# Patient Record
Sex: Male | Born: 1975 | Race: White | Hispanic: No | Marital: Married | Smoking: Never smoker
Health system: Southern US, Community
[De-identification: ages and names within clinical notes are randomized; demographics above are authoritative.]

## PROBLEM LIST (undated history)

## (undated) DIAGNOSIS — R569 Unspecified convulsions: Secondary | ICD-10-CM

## (undated) DIAGNOSIS — E119 Type 2 diabetes mellitus without complications: Secondary | ICD-10-CM

## (undated) DIAGNOSIS — G4730 Sleep apnea, unspecified: Secondary | ICD-10-CM

## (undated) HISTORY — PX: TONSILLECTOMY: SUR1361

## (undated) HISTORY — PX: APPENDECTOMY: SHX54

## (undated) HISTORY — DX: Unspecified convulsions: R56.9

---

## 2008-08-05 ENCOUNTER — Encounter (INDEPENDENT_AMBULATORY_CARE_PROVIDER_SITE_OTHER): Payer: Self-pay | Admitting: Interventional Radiology

## 2008-08-05 ENCOUNTER — Ambulatory Visit (HOSPITAL_COMMUNITY): Admission: RE | Admit: 2008-08-05 | Discharge: 2008-08-05 | Payer: Self-pay | Admitting: Internal Medicine

## 2010-01-19 IMAGING — US US BIOPSY
1 series · 7 of 7 positions shown · non-contrast
Comparison: none

Clinical: Elevated liver function tests, jaundice; request is made
for random core liver biopsy.

ULTRASOUND-GUIDED RANDOM CORE LIVER BIOPSY:
An ultrasound guided liver biopsy was thoroughly discussed with the
patient and questions were answered. The benefits, risks,
alternatives, and complications were also discussed. The patient
understands and wishes to proceed with the procedure. A verbal as
well as written consent was obtained.
Survey ultrasound of the liver was performed and an appropriate
skin entry site was determined. There was no evidence of
intrahepatic ductal dilatation and the common bile duct diameter
was within normal limits at 3 mm.  Incidental note is made of
gallstones and associated sludge.  Skin site was marked, prepped
with Betadine, and draped in usual sterile fashion, and infiltrated
locally with 1% Lidocaine. 100 mcg Intravenous Fentanyl and  2 mg
Intravenous Versed were administered as conscious sedation for 15
minutes during continuous cardiorespiratory monitoring by the
radiology RN. A 17 gauge Trocar needle was advanced under
ultrasound guidance into the liver (right hepatic lobe).  3 coaxial
18 gauge core samples were then obtained through the guide needle.
The guide needle was removed. Post procedure scans demonstrate no
apparent complication.

[Series 1: us biopsy · 0.28mm/px · 7 of 7 slices shown]
[im 1/7]
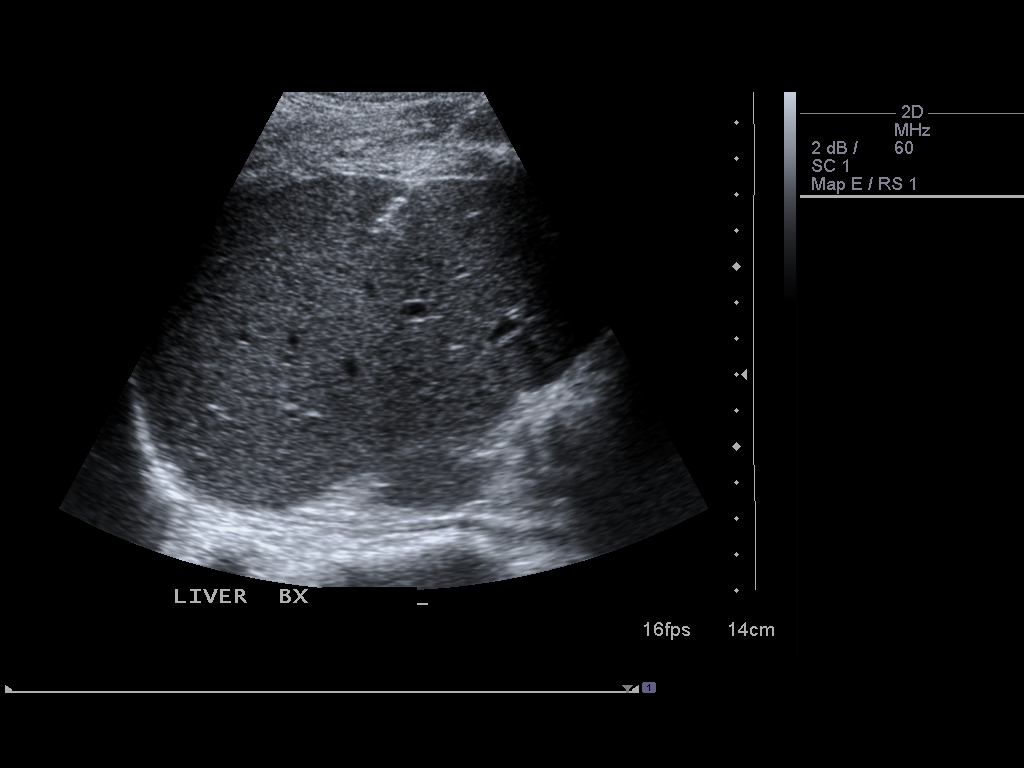
[im 2/7]
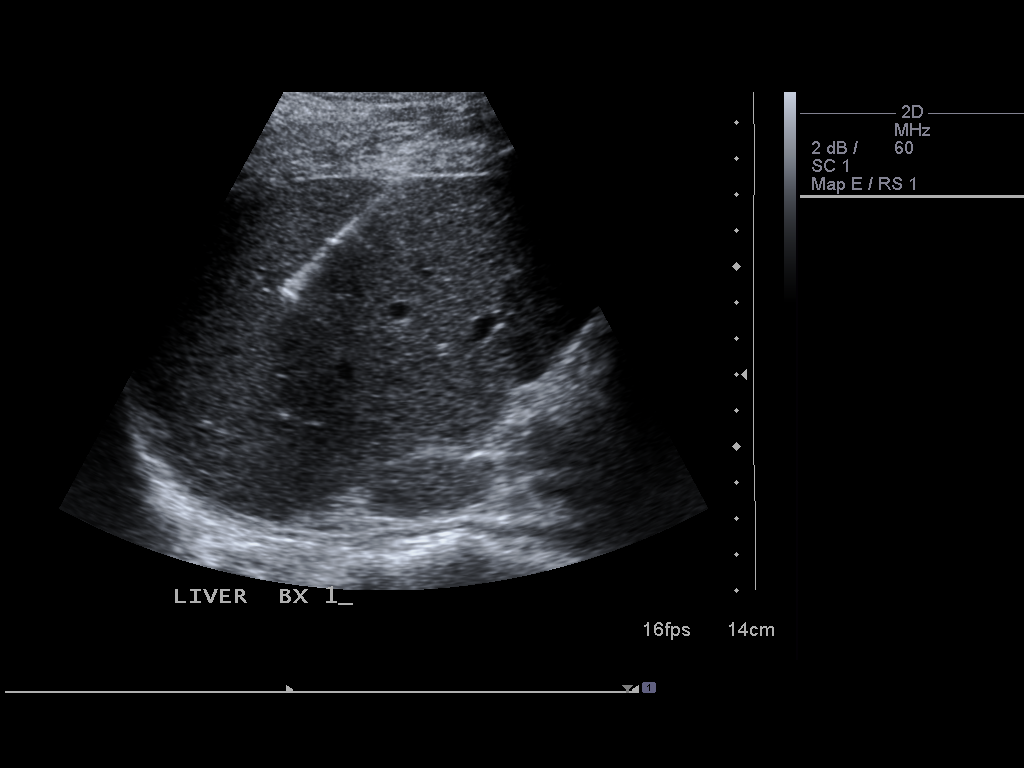
[im 3/7]
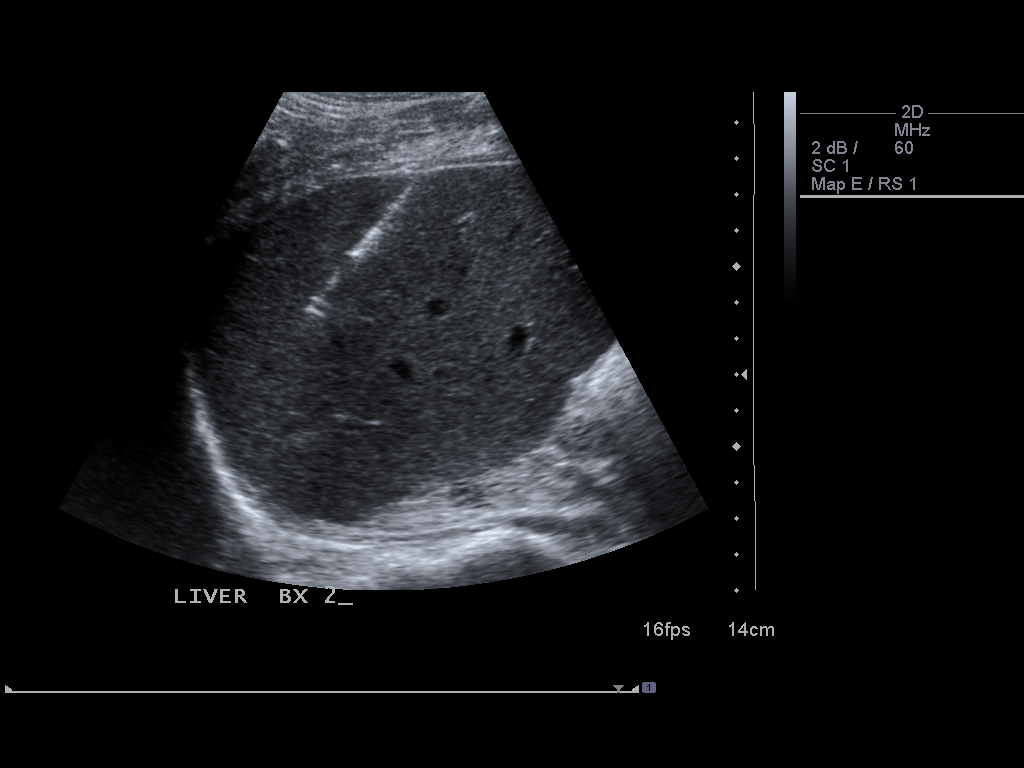
[im 4/7]
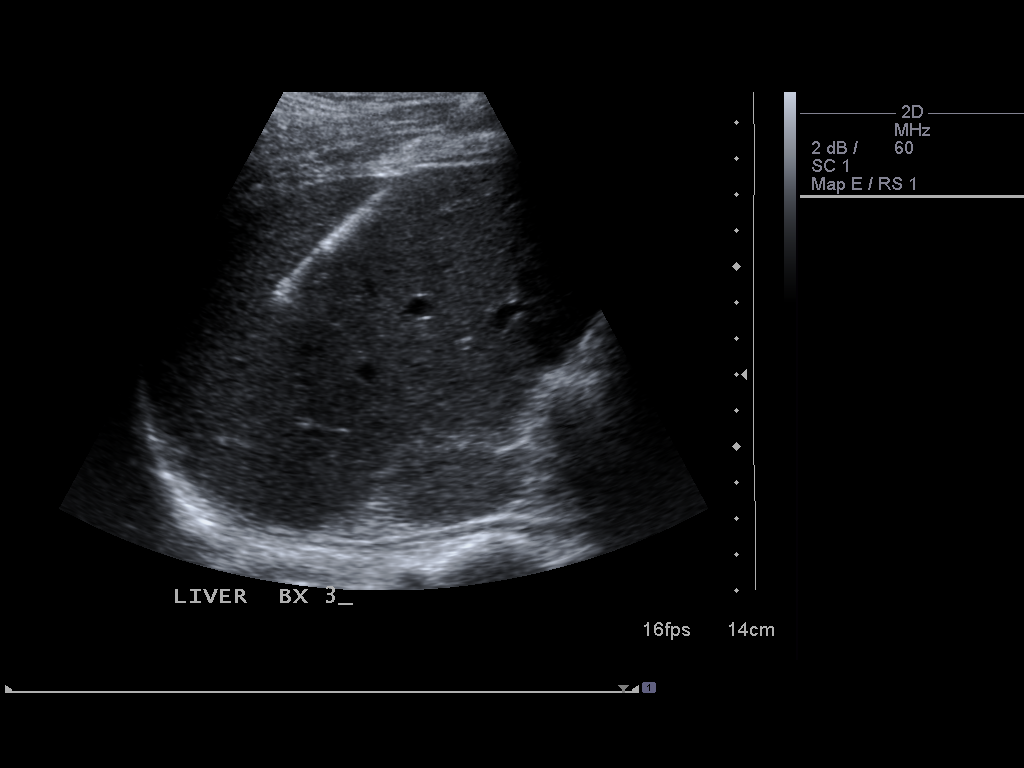
[im 5/7]
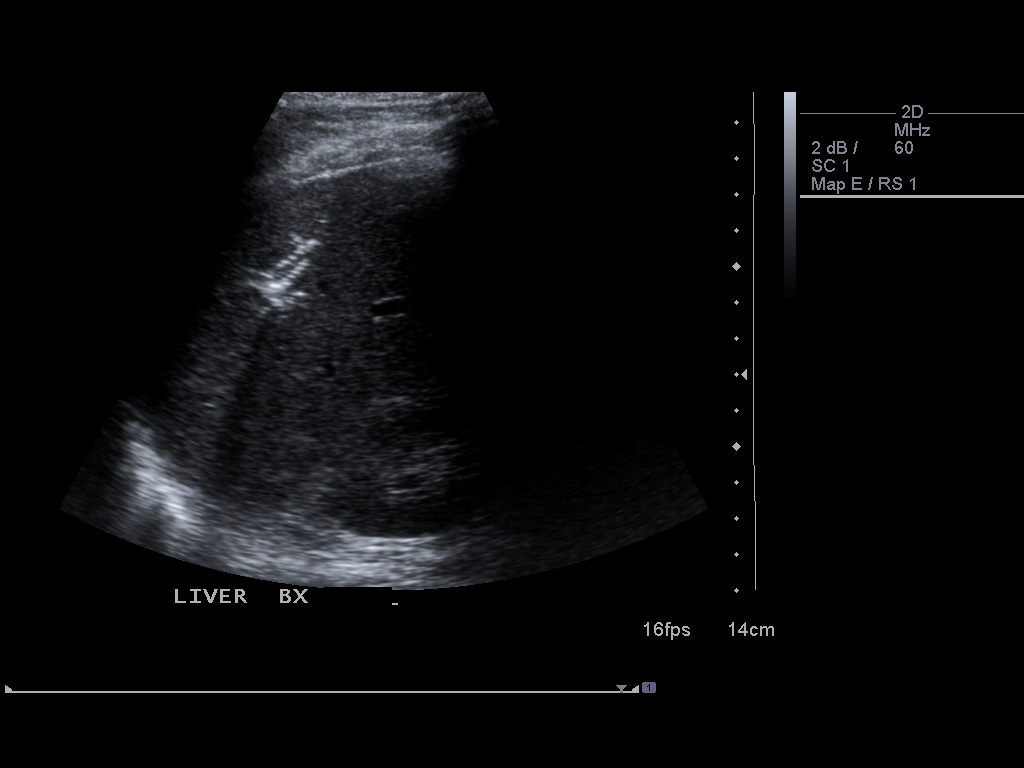
[im 6/7]
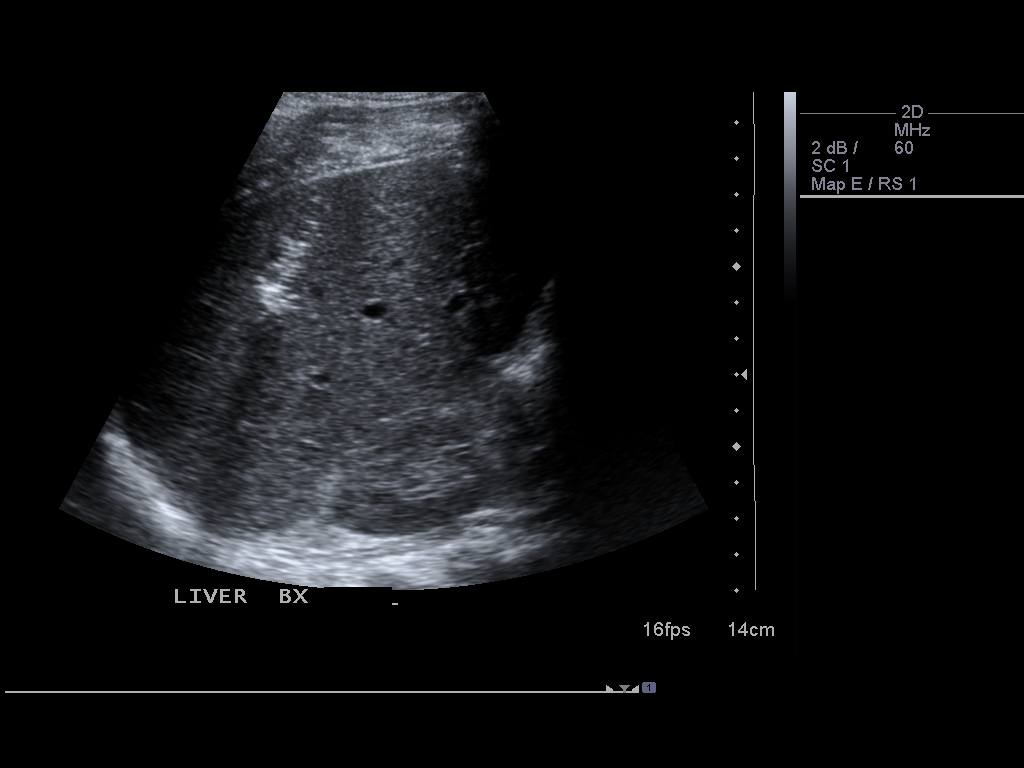
[im 7/7]
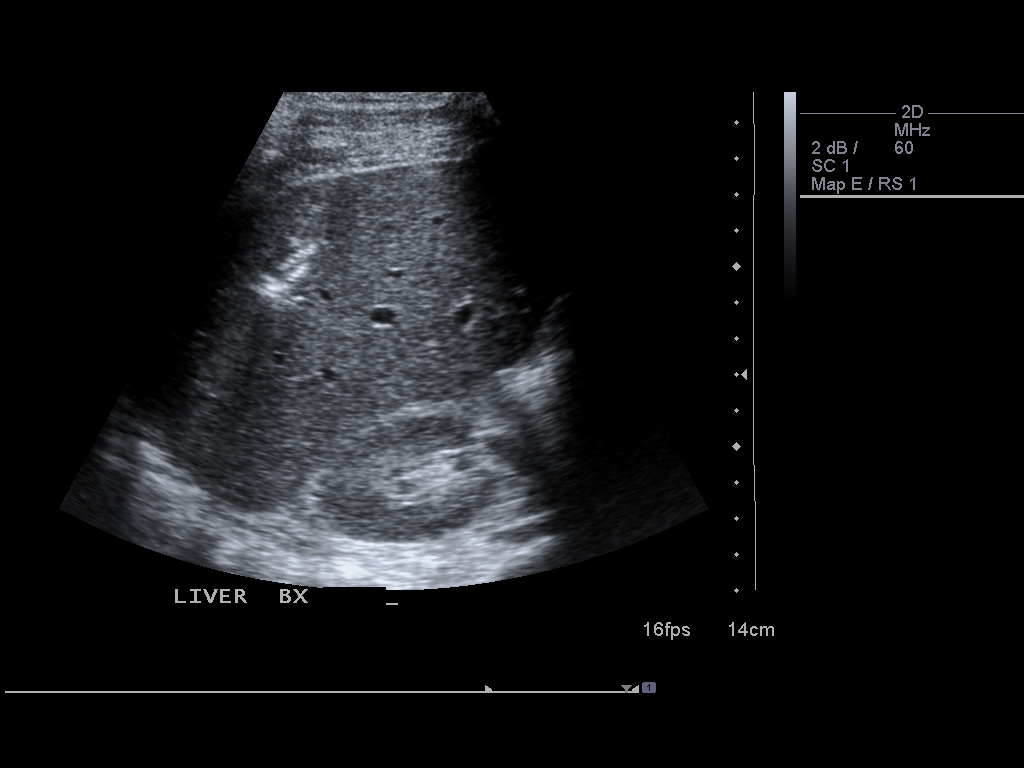

[7 of 7 positions shown; findings below may reference images not displayed]

IMPRESSION: 1. Technically successful ultrasound guided random core liver
biopsy. No evidence of intrahepatic ductal dilatation with normal
common bile duct diameter at 3 mm. Incidental note is made of
gallstones with associated sludge. The above images were reviewed
by Dr Romyka Miraka.

Read by: Loreto, Sherelle.-BULEUS

## 2010-04-09 LAB — PROTIME-INR
INR: 0.9 (ref 0.00–1.49)
Prothrombin Time: 12.1 seconds (ref 11.6–15.2)

## 2010-04-09 LAB — CBC
HCT: 41.2 % (ref 39.0–52.0)
Hemoglobin: 14.5 g/dL (ref 13.0–17.0)
MCHC: 35.3 g/dL (ref 30.0–36.0)
MCV: 85.3 fL (ref 78.0–100.0)
Platelets: 251 10*3/uL (ref 150–400)
RBC: 4.83 MIL/uL (ref 4.22–5.81)
RDW: 17 % — ABNORMAL HIGH (ref 11.5–15.5)
WBC: 8 10*3/uL (ref 4.0–10.5)

## 2010-04-09 LAB — GLUCOSE, CAPILLARY
Glucose-Capillary: 141 mg/dL — ABNORMAL HIGH (ref 70–99)
Glucose-Capillary: 194 mg/dL — ABNORMAL HIGH (ref 70–99)

## 2010-04-09 LAB — APTT: aPTT: 23 seconds — ABNORMAL LOW (ref 24–37)

## 2011-07-13 DIAGNOSIS — G40909 Epilepsy, unspecified, not intractable, without status epilepticus: Secondary | ICD-10-CM | POA: Insufficient documentation

## 2012-10-09 ENCOUNTER — Telehealth: Payer: Self-pay | Admitting: Endocrinology

## 2012-10-09 MED ORDER — INSULIN LISPRO 100 UNIT/ML CARTRIDGE: SUBCUTANEOUS | Status: DC

## 2012-10-09 NOTE — Telephone Encounter (Signed)
rx sent

## 2012-10-25 ENCOUNTER — Other Ambulatory Visit (INDEPENDENT_AMBULATORY_CARE_PROVIDER_SITE_OTHER): Payer: PRIVATE HEALTH INSURANCE

## 2012-10-25 ENCOUNTER — Other Ambulatory Visit: Payer: Self-pay | Admitting: *Deleted

## 2012-10-25 DIAGNOSIS — E119 Type 2 diabetes mellitus without complications: Secondary | ICD-10-CM

## 2012-10-25 LAB — COMPLETE METABOLIC PANEL WITH GFR
ALT: 22 U/L (ref 0–53)
AST: 14 U/L (ref 0–37)
Albumin: 4.1 g/dL (ref 3.5–5.2)
Alkaline Phosphatase: 52 U/L (ref 39–117)
BUN: 15 mg/dL (ref 6–23)
CO2: 27 mEq/L (ref 19–32)
Calcium: 9.5 mg/dL (ref 8.4–10.5)
Chloride: 101 mEq/L (ref 96–112)
Creat: 1.15 mg/dL (ref 0.50–1.35)
GFR, Est African American: >89 mL/min
GFR, Est Non African American: 82 mL/min
Glucose, Bld: 216 mg/dL — ABNORMAL HIGH (ref 70–99)
Potassium: 4.5 mEq/L (ref 3.5–5.3)
Sodium: 136 mEq/L (ref 135–145)
Total Bilirubin: 1.6 mg/dL — ABNORMAL HIGH (ref 0.3–1.2)
Total Protein: 6.3 g/dL (ref 6.0–8.3)

## 2012-10-25 LAB — MICROALBUMIN / CREATININE URINE RATIO
Creatinine,U: 75.6 mg/dL
Microalb Creat Ratio: 0.3 mg/g (ref 0.0–30.0)
Microalb, Ur: 0.2 mg/dL (ref 0.0–1.9)

## 2012-10-25 LAB — HEMOGLOBIN A1C: Hgb A1c MFr Bld: 7.5 % — ABNORMAL HIGH (ref 4.6–6.5)

## 2012-10-29 ENCOUNTER — Ambulatory Visit (INDEPENDENT_AMBULATORY_CARE_PROVIDER_SITE_OTHER): Payer: PRIVATE HEALTH INSURANCE | Admitting: Endocrinology

## 2012-10-29 ENCOUNTER — Encounter: Payer: Self-pay | Admitting: Endocrinology

## 2012-10-29 VITALS — BP 122/82 | HR 68 | Temp 98.2°F | Resp 12 | Ht 68.0 in | Wt 186.3 lb

## 2012-10-29 DIAGNOSIS — E109 Type 1 diabetes mellitus without complications: Secondary | ICD-10-CM | POA: Insufficient documentation

## 2012-10-29 DIAGNOSIS — Z23 Encounter for immunization: Secondary | ICD-10-CM

## 2012-10-29 NOTE — Progress Notes (Addendum)
Tommy Newman is an 37 y.o. male.   Reason for Appointment: Insulin Pump followup:   History of Present Illness   Diagnosis: Type 1 DIABETES MELITUS, date of diagnosis: 1991      DIABETES history: He has been on an insulin pump since 1997 His A1c in the last year has ranged between 6.3-7%, was 6.7% in 5/14 He is periodically able to make adjustments on his pump when he has tendency to high or low readings However although he knows how to carbohydrate fairly well he does not enter the carbohydrate in his pump at mealtime Generally checking his blood sugar frequently throughout the day  CURRENT insulin pump:  Medtronic 522  The pump SETTINGS are: Basal rate: Midnight = 1.0, 6:30 a.m. = 0.75, 12:30 PM = 1.3 and 8 PM = 1.75  Carb Ratio: 1: 15 Correction factor 1: 30 with target 90-140  GLUCOSE CONTROL with the pump is assessed today by pump download.  His blood sugar patterns show variability throughout the day but no consistent patterns of highs or lows. He is checking his blood sugar at least 8 times a day  Overall average blood sugar is 153 with standard deviation 64 He is having more frequent high readings around 1 PM and 6-8 PM and sporadically at other times His average blood sugar ranges from 93 up to 197 at any given time of the day Sugars are relatively lower when he is at work including at night shifts Since he has tendency to low sugars with activity he will suspend his pump when he is more active. Usually this does not cause hyperglycemia but occasionally his blood sugars may go back over 200 after a period of suspension, possibly from forgetting to restart pump. He has suspended there is pump for as much as 3 hours in a given day POSTPRANDIAL readings are difficult to assess as he does not enter carbohydrates while at bolusing but does not have higher readings are in the usual mealtimes  HYPERGLYCEMIA Occuring almost anytime of the day except usually near normal or slightly  low around midnight, 4 AM or 4 PM  HYPOGLYCEMIC episodes are rare but having sporadic low sugars at noon, 5 PM or 8 PM. Did have a low sugar of 48 at work on Sunday night but also had done a bolus right before. He will occasionally suspend his pump when his blood sugar goes down significantly  Lab Results  Component Value Date   HGBA1C 7.5* 10/25/2012    EXERCISE: He is active at work  MICROALBUMIN has been tested, and the result is normal .  Appointment on 10/25/2012  Component Date Value Range Status  . Hemoglobin A1C 10/25/2012 7.5* 4.6 - 6.5 % Final   Glycemic Control Guidelines for People with Diabetes:Non Diabetic:  <6%Goal of Therapy: <7%Additional Action Suggested:  >8%   . Sodium 10/25/2012 136  135 - 145 mEq/L Final  . Potassium 10/25/2012 4.5  3.5 - 5.3 mEq/L Final  . Chloride 10/25/2012 101  96 - 112 mEq/L Final  . CO2 10/25/2012 27  19 - 32 mEq/L Final  . Glucose, Bld 10/25/2012 216* 70 - 99 mg/dL Final  . BUN 16/10/9602 15  6 - 23 mg/dL Final  . Creat 54/09/8117 1.15  0.50 - 1.35 mg/dL Final  . Total Bilirubin 10/25/2012 1.6* 0.3 - 1.2 mg/dL Final  . Alkaline Phosphatase 10/25/2012 52  39 - 117 U/L Final  . AST 10/25/2012 14  0 - 37 U/L Final  .  ALT 10/25/2012 22  0 - 53 U/L Final  . Total Protein 10/25/2012 6.3  6.0 - 8.3 g/dL Final  . Albumin 28/41/3244 4.1  3.5 - 5.2 g/dL Final  . Calcium 01/04/7251 9.5  8.4 - 10.5 mg/dL Final  . GFR, Est African American 10/25/2012 >89   Final  . GFR, Est Non African American 10/25/2012 82   Final   Comment:                            The estimated GFR is a calculation valid for adults (>=22 years old)                          that uses the CKD-EPI algorithm to adjust for age and sex. It is                            not to be used for children, pregnant women, hospitalized patients,                             patients on dialysis, or with rapidly changing kidney function.                          According to the NKDEP, eGFR  >89 is normal, 60-89 shows mild                          impairment, 30-59 shows moderate impairment, 15-29 shows severe                          impairment and <15 is ESRD.                             Alyson Reedy, Ur 10/25/2012 0.2  0.0 - 1.9 mg/dL Final  . Creatinine,U 66/44/0347 75.6   Final  . Microalb Creat Ratio 10/25/2012 0.3  0.0 - 30.0 mg/g Final      Medication List       This list is accurate as of: 10/29/12  9:43 AM.  Always use your most recent med list.               atorvastatin 10 MG tablet  Commonly known as:  LIPITOR  Take 10 mg by mouth daily.     insulin lispro 100 UNIT/ML Soct  Commonly known as:  HUMALOG  Use 25-30 units per day with pump     LAMICTAL 100 MG tablet  Generic drug:  lamoTRIgine  100 mg.     NOVOLOG 100 UNIT/ML injection  Generic drug:  insulin aspart  40-45 Units.     ONE TOUCH ULTRA TEST test strip  Generic drug:  glucose blood  Checks 8-10 times per day        Allergies: No Known Allergies  No past medical history on file.  No past surgical history on file.  No family history on file.  Social History:  reports that he has never smoked. He has never used smokeless tobacco. His alcohol and drug histories are not on file.  REVIEW of systems:  No history of hypertension: Has been on Lipitor for hypercholesterolemia  EXAM:  BP 122/82  Pulse 68  Temp(Src) 98.2 F (36.8 C)  Resp 12  Ht 5\' 8"  (1.727 m)  Wt 186 lb 4.8 oz (84.505 kg)  BMI 28.33 kg/m2  SpO2 97%   ASSESSMENT:  Overall control is fairly good  HYPERGLYCEMIA Occuring almost anytime of the day except usually near normal or slightly low around midnight, 4 AM or 4 PM  HYPOGLYCEMIC episodes are rare but having sporadic low sugars at noon, 5 PM or 8 PM. Did have a low sugar of 48 at work on Sunday night but also had done a bolus right before. He will occasionally suspend his pump when his blood sugar goes down significantly Problems  identified: 1. Continue variability in his blood sugars throughout the day but no consistent trend  2. Blood sugars are the highest in the early afternoon and some in the late evening 3. He will have tendency to relatively lower readings late at night but only occasional significant hypoglycemia sporadically 4. Although he is suspending his pump for  increased activity he may occasionally not resume when he is less active 5. Does not appear to have higher readings after meals and he is somewhat arbitrarily covering his intake without entering carbohydrates, discussed needing to cover higher fat meals with extra insulin also 6. Tendency to lower readings when he is more active at work 7. 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 as some nights he has high readings and some nights there are low based on his activity level  Plan: Since he has tendency to high readings in the early afternoon and evening will change the time of his higher basal rates to 11:30 AM and 6 PM and also reduce her basal rate late at night when he had a tendency to lower readings He agrees to try a temporary basal rate for this duration of time for increased activity other than suspending the pump, this was demonstrated to him today  Hyperlipidemia: Check lipids on the next visit  Bryon Parker 10/29/2012, 9:43 AM   Addendum: Consider Pneumovax on next visit

## 2012-10-29 NOTE — Patient Instructions (Signed)
CHANGE BASALS

## 2012-10-31 ENCOUNTER — Encounter: Payer: Self-pay | Admitting: Endocrinology

## 2012-12-04 ENCOUNTER — Other Ambulatory Visit: Payer: Self-pay | Admitting: *Deleted

## 2012-12-04 MED ORDER — GLUCOSE BLOOD VI STRP: ORAL_STRIP | Status: DC

## 2012-12-04 MED ORDER — ATORVASTATIN CALCIUM 10 MG PO TABS: 10.0000 mg | ORAL_TABLET | Freq: Every day | ORAL | Status: DC

## 2012-12-04 MED ORDER — INSULIN ASPART 100 UNIT/ML ~~LOC~~ SOLN: SUBCUTANEOUS | Status: DC

## 2012-12-19 ENCOUNTER — Other Ambulatory Visit: Payer: Self-pay | Admitting: *Deleted

## 2012-12-19 MED ORDER — GLUCOSE BLOOD VI STRP: ORAL_STRIP | Status: DC

## 2013-01-24 DIAGNOSIS — Z0279 Encounter for issue of other medical certificate: Secondary | ICD-10-CM

## 2013-03-11 ENCOUNTER — Other Ambulatory Visit: Payer: Self-pay | Admitting: *Deleted

## 2013-03-11 MED ORDER — GLUCOSE BLOOD VI STRP: ORAL_STRIP | Status: DC

## 2013-04-09 ENCOUNTER — Other Ambulatory Visit: Payer: Self-pay | Admitting: Endocrinology

## 2013-04-09 DIAGNOSIS — E109 Type 1 diabetes mellitus without complications: Secondary | ICD-10-CM

## 2013-04-15 ENCOUNTER — Other Ambulatory Visit: Payer: Self-pay | Admitting: *Deleted

## 2013-04-15 ENCOUNTER — Encounter: Payer: PRIVATE HEALTH INSURANCE | Attending: Endocrinology | Admitting: Nutrition

## 2013-04-15 DIAGNOSIS — E109 Type 1 diabetes mellitus without complications: Secondary | ICD-10-CM

## 2013-04-15 MED ORDER — GLUCOSE BLOOD VI STRP: ORAL_STRIP | Status: DC

## 2013-04-15 NOTE — Patient Instructions (Signed)
Calibrate blood sugar at 12 noon, and every six to 12 hours after this. Insert a new sensor in 6 days. Read over manual today on directions for how to use this and how to pull up readings on his pump and recognize what all of the symbols meal.

## 2013-04-15 NOTE — Progress Notes (Signed)
Pt. Was using a loaner 530G pump.  His settings were transferred to his new 530 G Medtronic pump.:  Basal rates:  MN: 1.0, 6:30AM: 0.75,   11:30: 1.15,  4:30PM: 1.75,  10:00PM: 1.55 Bolus wizard settings were transferred to his new pump:  I/C: 15, Sensitivity: 30, targets: 100-140, timing 4 hours (was set on 2 hours),  Temp basal: %, Max bolus: 20u,  Max Basal: 3.0.  We reviewed the difference from Insterstitial blood sugar readings and blood sugar readings, and the need to calibrate his blood sugars q 6 hours.  He reported good understanding of this.  We also reviewed  how to insert the sensor, tape it, and attach the transmitter.  He did this and inserted the sensor into his abdominal area.  He reports never using this site for infusion sets.  He became, dizzy, nauseated and sweaty 3 minutes later.  His blood sugar was 90.  He reported that he felt better serveral min. After this.  We continued with the training, and he started the sensor, and he was told to calibrate the blood sugar in 2 hours, and again every 6 hours.   He reported good understanding of this.  He had no final questions.  He signed off on understanding all of the steps.  He was strongly encouraged to read the manual on how to insert the sensor, set/change the alert settings, and read the pump screen- recognizing what all of the symbols mean.  He agreed to do this.  He has an appt. With Dr. Lucianne MussKumar in 2 weeks, and I will review all of the above with him again.  He was instructed to call if questions before then

## 2013-04-16 ENCOUNTER — Other Ambulatory Visit: Payer: Self-pay | Admitting: *Deleted

## 2013-04-24 ENCOUNTER — Other Ambulatory Visit: Payer: PRIVATE HEALTH INSURANCE

## 2013-04-29 ENCOUNTER — Ambulatory Visit: Payer: PRIVATE HEALTH INSURANCE | Admitting: Endocrinology

## 2013-04-29 ENCOUNTER — Other Ambulatory Visit (INDEPENDENT_AMBULATORY_CARE_PROVIDER_SITE_OTHER): Payer: BC Managed Care – PPO

## 2013-04-29 DIAGNOSIS — E109 Type 1 diabetes mellitus without complications: Secondary | ICD-10-CM

## 2013-04-29 LAB — COMPREHENSIVE METABOLIC PANEL
ALT: 29 U/L (ref 0–53)
AST: 18 U/L (ref 0–37)
Albumin: 4.3 g/dL (ref 3.5–5.2)
Alkaline Phosphatase: 51 U/L (ref 39–117)
BUN: 16 mg/dL (ref 6–23)
CO2: 27 mEq/L (ref 19–32)
Calcium: 9.7 mg/dL (ref 8.4–10.5)
Chloride: 101 mEq/L (ref 96–112)
Creatinine, Ser: 1.2 mg/dL (ref 0.4–1.5)
GFR: 75.54 mL/min (ref >60.00)
Glucose, Bld: 207 mg/dL — ABNORMAL HIGH (ref 70–99)
Potassium: 4.3 mEq/L (ref 3.5–5.1)
Sodium: 135 mEq/L (ref 135–145)
Total Bilirubin: 1.7 mg/dL — ABNORMAL HIGH (ref 0.3–1.2)
Total Protein: 6.8 g/dL (ref 6.0–8.3)

## 2013-04-29 LAB — URINALYSIS, ROUTINE W REFLEX MICROSCOPIC
Bilirubin Urine: NEGATIVE
Hgb urine dipstick: NEGATIVE
Ketones, ur: NEGATIVE
Leukocytes, UA: NEGATIVE
Nitrite: NEGATIVE
RBC / HPF: NONE SEEN (ref 0–?)
Specific Gravity, Urine: 1.02 (ref 1.000–1.030)
Total Protein, Urine: NEGATIVE
Urine Glucose: 250 — AB
Urobilinogen, UA: 0.2 (ref 0.0–1.0)
WBC, UA: NONE SEEN (ref 0–?)
pH: 6 (ref 5.0–8.0)

## 2013-04-29 LAB — HEMOGLOBIN A1C: Hgb A1c MFr Bld: 6.9 % — ABNORMAL HIGH (ref 4.6–6.5)

## 2013-05-02 ENCOUNTER — Other Ambulatory Visit: Payer: PRIVATE HEALTH INSURANCE

## 2013-05-08 ENCOUNTER — Ambulatory Visit (INDEPENDENT_AMBULATORY_CARE_PROVIDER_SITE_OTHER): Payer: BC Managed Care – PPO | Admitting: Endocrinology

## 2013-05-08 ENCOUNTER — Encounter: Payer: Self-pay | Admitting: Endocrinology

## 2013-05-08 VITALS — BP 118/84 | HR 78 | Temp 98.4°F | Resp 14 | Ht 68.0 in | Wt 186.4 lb

## 2013-05-08 DIAGNOSIS — E1065 Type 1 diabetes mellitus with hyperglycemia: Secondary | ICD-10-CM

## 2013-05-08 DIAGNOSIS — IMO0002 Reserved for concepts with insufficient information to code with codable children: Secondary | ICD-10-CM

## 2013-05-08 NOTE — Progress Notes (Signed)
Tommy Newman is an 38 y.o. male.     Reason for Appointment: Insulin Pump followup:   History of Present Illness   Diagnosis: Type 1 DIABETES MELITUS, date of diagnosis: 1991      DIABETES history: He has been on an insulin pump since 1997 His A1c in the last year has ranged between 6.3-7%, was 6.7% in 5/14 He is periodically able to make adjustments on his pump when he has tendency to high or low readings  Generally checking his blood sugar frequently throughout the day  CURRENT insulin pump:  Medtronic 522  The pump SETTINGS are: Basal rate: Midnight = 1.0, 6:30 a.m. = 0.95,   10 a.m. = 1.0, 4:30 a.m. = 0.975 and 8 PM = 1.7  Carb Ratio: 1: 15 Correction factor 1: 30 with target 90-140  Since he knows how to estimate carbohydrate coverage fairly well he does not enter the carbohydrate in his pump at mealtimes He is  using the bolus wizard to calculate a correction factor and has 2.4 bolus wizard events per dayorrection factor 1: 30 with target 90-140   GLUCOSE CONTROL with the pump is assessed today by pump download and continuous close monitoring.  Recently has started using the new glucose monitor with the sensor and threshold suspend  On his own he has increased his basal rate early morning but reduced it during the afternoon significantly. Current blood sugar patterns:  Mostly higher readings on waking up recently but overall these are variable  Blood sugars are rising between about 2 PM-7 PM although there is significant variability at this time  Blood sugar is also rising late evening until about 2 to 3 AM  Has tendency to low blood sugars early-morning are around 3 to 5 AM when he is working and has had at least 2 episodes of threshold suspend between 3 AM-6 AM and also periodically around 1 PM and 3-6 PM  He is checking his blood sugar less often overall since he has had the continuous monitor However his A1c is slightly lower without excessive hypoglycemia Has had  fewer episodes of low blood sugars with using the sensor  CGM RECORD INTERPRETATION    Dates of Recording: 04/24/13 through 05/07/13          Sensor  summary:  Quality of the data is excellent with adequate sensor function.  His blood sugar is averaging 157+/-59 with the sensor compared to the fingerstick average of 188  He has had 51 hyperglycemic episodes and most of these are related to rising sensor rate of change without bolus There are 5 hypoglycemic episodes, mostly showing hyperglycemia preceding hypoglycemia       Glycemic patterns:  Blood sugars are showing average readings of about 180 starting at midnight gradually declining to walk 140 at 7 AM with relatively flat readings until about 2 PM showing subsequent rise to about 180; sugars are higher until about 7 PM when they are back to about 150 The most fluctuation occurs between 2 PM-6 PM     Overnight periods:   does tend to have higher readings at times midday but not clear these are when he is sleeping. Also during the night hours when he is working he has variable readings including some readings as high as 400+ and occasionally hypoglycemic episodes      Preprandial periods:   not clear from his boluses which of the bolus events are related to meal times or to correction     Postprandial  periods:   is a difficult to assess since he has not been entering carbohydrates at mealtimes and has no distinct mealtimes especially with working night shift      Hypoglycemia:  these have occurred between about 4:30 AM and 6 AM as well as periodically around 2-4 PM but transiently.          Lab Results  Component Value Date   HGBA1C 6.9* 04/29/2013   HGBA1C 7.5* 10/25/2012   Lab Results  Component Value Date   MICROALBUR 0.2 10/25/2012   CREATININE 1.2 04/29/2013     No visits with results within 1 Week(s) from this visit. Latest known visit with results is:  Appointment on 04/29/2013  Component Date Value Ref Range Status   . Hemoglobin A1C 04/29/2013 6.9* 4.6 - 6.5 % Final   Glycemic Control Guidelines for People with Diabetes:Non Diabetic:  <6%Goal of Therapy: <7%Additional Action Suggested:  >8%   . Sodium 04/29/2013 135  135 - 145 mEq/L Final  . Potassium 04/29/2013 4.3  3.5 - 5.1 mEq/L Final  . Chloride 04/29/2013 101  96 - 112 mEq/L Final  . CO2 04/29/2013 27  19 - 32 mEq/L Final  . Glucose, Bld 04/29/2013 207* 70 - 99 mg/dL Final  . BUN 16/10/960404/28/2015 16  6 - 23 mg/dL Final  . Creatinine, Ser 04/29/2013 1.2  0.4 - 1.5 mg/dL Final  . Total Bilirubin 04/29/2013 1.7* 0.3 - 1.2 mg/dL Final  . Alkaline Phosphatase 04/29/2013 51  39 - 117 U/L Final  . AST 04/29/2013 18  0 - 37 U/L Final  . ALT 04/29/2013 29  0 - 53 U/L Final  . Total Protein 04/29/2013 6.8  6.0 - 8.3 g/dL Final  . Albumin 54/09/811904/28/2015 4.3  3.5 - 5.2 g/dL Final  . Calcium 14/78/295604/28/2015 9.7  8.4 - 10.5 mg/dL Final  . GFR 21/30/865704/28/2015 75.54  >60.00 mL/min Final  . Color, Urine 04/29/2013 YELLOW  Yellow;Lt. Yellow Final  . APPearance 04/29/2013 CLEAR  Clear Final  . Specific Gravity, Urine 04/29/2013 1.020  1.000-1.030 Final  . pH 04/29/2013 6.0  5.0 - 8.0 Final  . Total Protein, Urine 04/29/2013 NEGATIVE  Negative Final  . Urine Glucose 04/29/2013 250* Negative Final  . Ketones, ur 04/29/2013 NEGATIVE  Negative Final  . Bilirubin Urine 04/29/2013 NEGATIVE  Negative Final  . Hgb urine dipstick 04/29/2013 NEGATIVE  Negative Final  . Urobilinogen, UA 04/29/2013 0.2  0.0 - 1.0 Final  . Leukocytes, UA 04/29/2013 NEGATIVE  Negative Final  . Nitrite 04/29/2013 NEGATIVE  Negative Final  . WBC, UA 04/29/2013 none seen  0-2/hpf Final  . RBC / HPF 04/29/2013 none seen  0-2/hpf Final  . Squamous Epithelial / LPF 04/29/2013 Rare(0-4/hpf)  Rare(0-4/hpf) Final      Medication List       This list is accurate as of: 05/08/13 11:03 AM.  Always use your most recent med list.               atorvastatin 10 MG tablet  Commonly known as:  LIPITOR  Take 1  tablet (10 mg total) by mouth daily.     glucose blood test strip  Commonly known as:  BAYER CONTOUR NEXT TEST  Use as instructed to check blood sugar 8-10 times per day dx code 250.01     insulin aspart 100 UNIT/ML injection  Commonly known as:  NOVOLOG  Use 50 units max per day with pump     insulin lispro 100 UNIT/ML cartridge  Commonly known as:  HUMALOG  Use 25-30 units per day with pump     LAMICTAL 100 MG tablet  Generic drug:  lamoTRIgine  100 mg.        Allergies: No Known Allergies  No past medical history on file.  No past surgical history on file.  No family history on file.  Social History:  reports that he has never smoked. He has never used smokeless tobacco. His alcohol and drug histories are not on file.  REVIEW of systems:  No history of hypertension: Has been on Lipitor for hypercholesterolemia  No results found for this basename: CHOL, HDL, LDLCALC, LDLDIRECT, TRIG, CHOLHDL     EXAM:  BP 118/84  Pulse 78  Temp(Src) 98.4 F (36.9 C)  Resp 14  Ht 5\' 8"  (1.727 m)  Wt 186 lb 6.4 oz (84.55 kg)  BMI 28.35 kg/m2  SpO2 97%   ASSESSMENT:  Overall control is fairly good He appears to have somewhat better A1c and less hypoglycemia with starting to use his continuous glucose monitoring See history of present illness for detailed analysis and discussion of day-to-day management of the bottom  HYPERGLYCEMIA Occuring mostly around 6 PM and also later in the evening as also between midnight-2 AM as judged by his fingersticks but it is continuous glucose monitoring this is occurring mostly between 2 PM-6 PM and also around midnight. Because of his variable work hours it is difficult to adjust the settings  For now we will increase his basal rate between 3:30 PM-7 PM up to 1.1 and go back to 0.95 at 7 PM He can further fine-tune his basal rates based on his sensor  HYPOGLYCEMIC episodes appear to be prevented by  threshold suspend of his new pump and  this may last for up to 3 hours. However he may tend to have excessive rebound following this and will reduce his threshold to 65 instead of 70    Tommy Newman 05/08/2013, 11:03 AM

## 2013-05-28 ENCOUNTER — Other Ambulatory Visit: Payer: Self-pay | Admitting: *Deleted

## 2013-05-28 ENCOUNTER — Telehealth: Payer: Self-pay | Admitting: *Deleted

## 2013-05-28 MED ORDER — INSULIN ASPART 100 UNIT/ML ~~LOC~~ SOLN: SUBCUTANEOUS | Status: DC

## 2013-05-28 NOTE — Telephone Encounter (Signed)
Need refill on Insulin Novolog  Tried to call pharmacy thinks he has the wrong #

## 2013-06-09 ENCOUNTER — Other Ambulatory Visit: Payer: Self-pay | Admitting: Endocrinology

## 2013-06-23 LAB — HM DIABETES EYE EXAM

## 2013-06-26 ENCOUNTER — Encounter: Payer: Self-pay | Admitting: *Deleted

## 2013-08-04 ENCOUNTER — Other Ambulatory Visit: Payer: Self-pay | Admitting: *Deleted

## 2013-08-04 ENCOUNTER — Telehealth (HOSPITAL_COMMUNITY): Payer: Self-pay | Admitting: Endocrinology

## 2013-08-04 MED ORDER — GLUCOSE BLOOD VI STRP: ORAL_STRIP | Status: DC

## 2013-08-04 NOTE — Telephone Encounter (Signed)
rx sent

## 2013-08-04 NOTE — Telephone Encounter (Signed)
Patient would like a refill of contour next test strips.

## 2013-08-11 ENCOUNTER — Telehealth: Payer: Self-pay | Admitting: Endocrinology

## 2013-08-11 NOTE — Telephone Encounter (Signed)
rx for test strips was sent on 8/3, if he doesn't know what rx it is, I can't help him.

## 2013-08-11 NOTE — Telephone Encounter (Signed)
Patient has a prescriptions that need a priorauthorization, he doesn't  know which one it is, he thinking it could be the contour test strips.  Please advise

## 2013-08-20 ENCOUNTER — Other Ambulatory Visit: Payer: Self-pay | Admitting: Endocrinology

## 2013-09-02 ENCOUNTER — Other Ambulatory Visit: Payer: BC Managed Care – HMO

## 2013-09-11 ENCOUNTER — Ambulatory Visit: Payer: BC Managed Care – HMO | Admitting: Endocrinology

## 2013-11-24 ENCOUNTER — Telehealth: Payer: Self-pay | Admitting: Endocrinology

## 2013-11-24 NOTE — Telephone Encounter (Signed)
Patient need refill of Novalog, Countour glucose strips and sensor, silhouette for insulin pump

## 2013-11-24 NOTE — Telephone Encounter (Signed)
Patient needs to be seen in the office before refills will be given

## 2013-11-24 NOTE — Telephone Encounter (Signed)
[facility] Phone# [phone]  Fax# [phone]

## 2013-11-25 ENCOUNTER — Other Ambulatory Visit: Payer: Self-pay | Admitting: *Deleted

## 2013-11-25 MED ORDER — INSULIN ASPART 100 UNIT/ML ~~LOC~~ SOLN: SUBCUTANEOUS | Status: DC

## 2013-12-03 ENCOUNTER — Other Ambulatory Visit: Payer: Self-pay | Admitting: *Deleted

## 2013-12-03 ENCOUNTER — Ambulatory Visit (INDEPENDENT_AMBULATORY_CARE_PROVIDER_SITE_OTHER): Payer: BC Managed Care – PPO | Admitting: Endocrinology

## 2013-12-03 ENCOUNTER — Telehealth: Payer: Self-pay | Admitting: Endocrinology

## 2013-12-03 ENCOUNTER — Other Ambulatory Visit: Payer: BC Managed Care – PPO

## 2013-12-03 ENCOUNTER — Encounter: Payer: Self-pay | Admitting: Endocrinology

## 2013-12-03 VITALS — BP 124/76 | HR 63 | Temp 98.0°F | Resp 14 | Ht 68.0 in | Wt 188.8 lb

## 2013-12-03 DIAGNOSIS — E119 Type 2 diabetes mellitus without complications: Secondary | ICD-10-CM

## 2013-12-03 DIAGNOSIS — E1065 Type 1 diabetes mellitus with hyperglycemia: Secondary | ICD-10-CM

## 2013-12-03 DIAGNOSIS — E785 Hyperlipidemia, unspecified: Secondary | ICD-10-CM

## 2013-12-03 DIAGNOSIS — IMO0002 Reserved for concepts with insufficient information to code with codable children: Secondary | ICD-10-CM

## 2013-12-03 DIAGNOSIS — Z23 Encounter for immunization: Secondary | ICD-10-CM

## 2013-12-03 LAB — URINALYSIS, ROUTINE W REFLEX MICROSCOPIC
Bilirubin Urine: NEGATIVE
Hgb urine dipstick: NEGATIVE
Ketones, ur: NEGATIVE
Leukocytes, UA: NEGATIVE
Nitrite: NEGATIVE
RBC / HPF: NONE SEEN (ref 0–?)
Specific Gravity, Urine: >=1.03 — AB (ref 1.000–1.030)
Total Protein, Urine: NEGATIVE
Urine Glucose: NEGATIVE
Urobilinogen, UA: 0.2 (ref 0.0–1.0)
WBC, UA: NONE SEEN (ref 0–?)
pH: 6 (ref 5.0–8.0)

## 2013-12-03 LAB — MICROALBUMIN / CREATININE URINE RATIO
Creatinine,U: 259.3 mg/dL
Microalb Creat Ratio: 0.3 mg/g (ref 0.0–30.0)
Microalb, Ur: 0.7 mg/dL (ref 0.0–1.9)

## 2013-12-03 LAB — HEMOGLOBIN A1C: Hgb A1c MFr Bld: 7.7 % — ABNORMAL HIGH (ref 4.6–6.5)

## 2013-12-03 MED ORDER — GLUCOSE BLOOD VI STRP: ORAL_STRIP | Status: AC

## 2013-12-03 MED ORDER — GLUCOSE BLOOD VI STRP: ORAL_STRIP | Status: DC

## 2013-12-03 NOTE — Progress Notes (Signed)
Tommy Newman is an 38 y.o. male.     Reason for Appointment: Insulin Pump followup:   History of Present Illness   Diagnosis: Type 1 DIABETES MELITUS, date of diagnosis: 1991      DIABETES history: He has been on an insulin pump since 1997  CURRENT insulin pump:  Medtronic 530 G-551  He has somewhat labile blood sugars periodically and no consistent trend usually He generally tries to adjust his basal rate based on his blood sugars However compared to the last visit he has several more basal rates programmed in his pump Generally checking his blood sugar frequently throughout the day and also at work More recently has been working night shifts 3-4 days a week; however his using the same basal rates on workdays and off days Since he knows how to estimate carbohydrate coverage fairly well he does not enter the carbohydrate in his pump at mealtimes He is  using the bolus wizard to calculate a correction factor Not using the continuous glucose monitoring recently because of the cost He has 1.9 bolus wizard events per dayorrection factor 1: 30 with target 90-140   His A1c has generally been right around 7%, occasionally higher  The pump SETTINGS are: Basal rate: Midnight = 1.25, 1:30 a.m. = 1.0; 5 AM = 1.15, 7:30 AM = 1.25.  9:30 a.m. = 1.3,  1:30 a.m. = 1.5; 3 PM = 1.2, 4 PM = 0.5 and 5 PM = 1.8   Carb Ratio: 1: 15 Correction factor 1: 30 with target 90-140  GLUCOSE CONTROL with the pump is assessed today by pump download   On his own he has reduced his basal rate at 9:30 AM because of tendency to low sugars midday but he just did this last night Current blood sugar patterns and current management:  Generally no consistent pattern with variability at all times  He has the most variability of blood sugars around 4-6 PM and also some late evening and around 7 AM  Blood sugars are mostly higher around 5 PM and after 8 PM  Blood sugars may be higher during his work hours at night  but also appears to be suspending his pump periodically probably because of symptoms of hypoglycemia  Lowest blood sugars are usually in the early afternoons  Correction factor appears to be fairly adequate without causing hypoglycemia  Difficult to assess postprandial patterns as he is not entering carbohydrates but glucose readings after boluses are variable  He thinks he is occasionally using the dual wave bolus for higher fat meals such as [place] food  He thinks his blood sugars may tend to be higher when he is ready to refill his cartridge  Blood sugar average is 172+/-87, blood sugar range 41-371  May 10 to rebound after low sugars at times    Lab Results  Component Value Date   HGBA1C 6.9* 04/29/2013   HGBA1C 7.5* 10/25/2012   Lab Results  Component Value Date   MICROALBUR 0.2 10/25/2012   CREATININE 1.2 04/29/2013     No visits with results within 1 Week(s) from this visit. Latest known visit with results is:  Abstract on 06/26/2013  Component Date Value Ref Range Status  . HM Diabetic Eye Exam 06/23/2013 No Retinopathy  No Retinopathy Final      Medication List       This list is accurate as of: 12/03/13 11:53 AM.  Always use your most recent med list.  atorvastatin 10 MG tablet  Commonly known as:  LIPITOR  TAKE 1 TABLET DAILY     glucose blood test strip  Commonly known as:  BAYER CONTOUR NEXT TEST  Use as instructed to check blood sugar 7-8 times per day dx code E10.65     insulin aspart 100 UNIT/ML injection  Commonly known as:  NOVOLOG  Use 50 units max per day with pump     LAMICTAL 100 MG tablet  Generic drug:  lamoTRIgine  100 mg.        Allergies: No Known Allergies  Past Medical History  Diagnosis Date  . Seizures since 2008    No past surgical history on file.  Family History  Problem Relation Age of Onset  . Diabetes Neg Hx   . Heart disease Neg Hx     Social History:  reports that he has never smoked.  He has never used smokeless tobacco. His alcohol and drug histories are not on file.  REVIEW of systems:  No history of hypertension: Has been on Lipitor for hypercholesterolemia and needs new set of labs  No results found for: CHOL  No numbness or tingling in his feet  No retinopathy on his last exam  EXAM:  BP 124/76 mmHg  Pulse 63  Temp(Src) 98 F (36.7 C)  Resp 14  Ht 5\' 8"  (1.727 m)  Wt 188 lb 12.8 oz (85.639 kg)  BMI 28.71 kg/m2  SpO2 97%  Diabetic foot exam shows normal monofilament sensation in the toes and plantar surfaces, no skin lesions or ulcers on the feet and normal pedal pulses   ASSESSMENT:  See history of present illness for detailed analysis of blood sugars and discussion of day-to-day management and problem areas    Blood sugar control as judged by his home monitoring is only fair with average glucose 172 Most of his readings are higher towards the evenings but not consistently He has just reduce his late morning basal rate which should help produced a tendency to hypoglycemia midday Also discussed needing to use a different basal rate for his work schedule at night since he tends to have lower readings when he wakes up; not clear if he needs a different basal rate during his work hours as his blood sugars are appearing erratic at those times. Discussed bolusing for various types of meals including snacks Basal rate changes: He will eliminate the 4 PM basal rate of 0.5 and increase the 5 PM basal rate to 1.9 He will continue to adjust his basal rate further based on trends Given new set of basal rates for work hours when he will try to use 1.3 basal rate at 1:30 PM and 1.8 at 5 PM  Counseling time over 50% of today's 25 minute visit    Tommy Newman 12/03/2013, 11:53 AM

## 2013-12-03 NOTE — Telephone Encounter (Signed)
Patient states his rx was denied for test strips   Was advised to try [facility] in [place]    Contour next glucose strips     Thank you

## 2013-12-04 LAB — LIPID PANEL
Cholesterol: 126 mg/dL (ref 0–200)
HDL: 47 mg/dL (ref >39.00)
LDL Cholesterol: 72 mg/dL (ref 0–99)
NonHDL: 79
Total CHOL/HDL Ratio: 3
Triglycerides: 35 mg/dL (ref 0.0–149.0)
VLDL: 7 mg/dL (ref 0.0–40.0)

## 2013-12-04 LAB — BASIC METABOLIC PANEL
BUN: 15 mg/dL (ref 6–23)
CO2: 26 mEq/L (ref 19–32)
Calcium: 9 mg/dL (ref 8.4–10.5)
Chloride: 104 mEq/L (ref 96–112)
Creatinine, Ser: 1.2 mg/dL (ref 0.4–1.5)
GFR: 72.4 mL/min (ref >60.00)
Glucose, Bld: 152 mg/dL — ABNORMAL HIGH (ref 70–99)
Potassium: 3.6 mEq/L (ref 3.5–5.1)
Sodium: 132 mEq/L — ABNORMAL LOW (ref 135–145)

## 2013-12-10 ENCOUNTER — Other Ambulatory Visit: Payer: Self-pay | Admitting: *Deleted

## 2013-12-10 ENCOUNTER — Telehealth: Payer: Self-pay | Admitting: Endocrinology

## 2013-12-10 MED ORDER — INSULIN ASPART 100 UNIT/ML ~~LOC~~ SOLN: SUBCUTANEOUS | Status: DC

## 2013-12-10 NOTE — Telephone Encounter (Signed)
Pt needs refill on Novolog 90 day supply. He also wanted to see if we received the request from [facility] about the one touch ultra two.

## 2013-12-11 ENCOUNTER — Encounter: Payer: Self-pay | Admitting: Endocrinology

## 2013-12-15 ENCOUNTER — Ambulatory Visit: Payer: BC Managed Care – HMO | Admitting: Endocrinology

## 2014-01-08 ENCOUNTER — Encounter: Payer: Self-pay | Admitting: Endocrinology

## 2014-01-17 ENCOUNTER — Other Ambulatory Visit: Payer: Self-pay | Admitting: Endocrinology

## 2014-02-11 ENCOUNTER — Other Ambulatory Visit: Payer: Self-pay | Admitting: Endocrinology

## 2014-03-05 ENCOUNTER — Other Ambulatory Visit: Payer: Self-pay | Admitting: Endocrinology

## 2014-03-06 ENCOUNTER — Other Ambulatory Visit: Payer: Self-pay | Admitting: *Deleted

## 2014-03-06 MED ORDER — INSULIN ASPART 100 UNIT/ML ~~LOC~~ SOLN: SUBCUTANEOUS | Status: DC

## 2014-03-26 ENCOUNTER — Encounter: Payer: Self-pay | Admitting: Endocrinology

## 2014-04-07 ENCOUNTER — Ambulatory Visit: Payer: BC Managed Care – PPO | Admitting: Endocrinology

## 2014-04-23 ENCOUNTER — Encounter: Payer: Self-pay | Admitting: Endocrinology

## 2014-04-23 ENCOUNTER — Ambulatory Visit (INDEPENDENT_AMBULATORY_CARE_PROVIDER_SITE_OTHER): Payer: BLUE CROSS/BLUE SHIELD | Admitting: Endocrinology

## 2014-04-23 VITALS — BP 116/78 | HR 75 | Temp 98.3°F | Resp 14 | Ht 68.0 in | Wt 186.2 lb

## 2014-04-23 DIAGNOSIS — E785 Hyperlipidemia, unspecified: Secondary | ICD-10-CM

## 2014-04-23 DIAGNOSIS — E1065 Type 1 diabetes mellitus with hyperglycemia: Secondary | ICD-10-CM | POA: Diagnosis not present

## 2014-04-23 DIAGNOSIS — IMO0002 Reserved for concepts with insufficient information to code with codable children: Secondary | ICD-10-CM

## 2014-04-23 LAB — COMPREHENSIVE METABOLIC PANEL
ALT: 27 U/L (ref 0–53)
AST: 24 U/L (ref 0–37)
Albumin: 4.3 g/dL (ref 3.5–5.2)
Alkaline Phosphatase: 68 U/L (ref 39–117)
BUN: 20 mg/dL (ref 6–23)
CO2: 27 mEq/L (ref 19–32)
Calcium: 9.8 mg/dL (ref 8.4–10.5)
Chloride: 101 mEq/L (ref 96–112)
Creatinine, Ser: 1.36 mg/dL (ref 0.40–1.50)
GFR: 62.54 mL/min (ref >60.00)
Glucose, Bld: 191 mg/dL — ABNORMAL HIGH (ref 70–99)
Potassium: 4.7 mEq/L (ref 3.5–5.1)
Sodium: 135 mEq/L (ref 135–145)
Total Bilirubin: 2 mg/dL — ABNORMAL HIGH (ref 0.2–1.2)
Total Protein: 7 g/dL (ref 6.0–8.3)

## 2014-04-23 LAB — HEMOGLOBIN A1C: Hgb A1c MFr Bld: 7 % — ABNORMAL HIGH (ref 4.6–6.5)

## 2014-04-23 LAB — MICROALBUMIN / CREATININE URINE RATIO
Creatinine,U: 91 mg/dL
Microalb Creat Ratio: 0.8 mg/g (ref 0.0–30.0)
Microalb, Ur: <0.7 mg/dL (ref 0.0–1.9)

## 2014-04-23 NOTE — Progress Notes (Signed)
Quick Note:  A1c is better at 7 ______

## 2014-04-23 NOTE — Progress Notes (Signed)
Tommy Newman is a 39 y.o. male.              Reason for Appointment: Insulin Pump followup:   History of Present Illness   Diagnosis: Type 1 DIABETES MELITUS, date of diagnosis: 1991      DIABETES history: He has been on an insulin pump since 1997  CURRENT insulin pump:  Medtronic 530 G-551  Overall he has had fairly good control but has tendency to  labile blood sugars  A1c is generally in the 7-7.5 range with some variability Control is somewhat difficult because of his working 12 hour shift and periodically will work night shifts also  Although he was advised to use a different basal rate on his work nights he is still using the same basal rate for both workdays and off days He generally tries to adjust his basal rate based on his blood sugar pattern. BASAL rates are much different than on his last visit and he is generally using less basal rate during the night hours Not clear why he has a markedly increased basal rate at about 2 PM; previously his basal rate in the afternoon was between 1.3 and 1.5 He has started using the continuous glucose monitoring more recently  The pump SETTINGS are: Basal rate: Midnight = 1.05, 3:30 AM = 1.05, 8:30 AM = 0.55, 9:30 AM -1.9, 2 PM = 2.25.  6 PM = 1.35, 7:30 PM = 0.65, 10 PM = 0.825     Carb Ratio: 1: 15 Correction factor 1: 30 with target 90-125  GLUCOSE CONTROL with the pump is assessed today by pump and continuous glucose monitoring download Current blood sugar patterns and current management:  Difficult to assess his wake and sleep hour pattern since he is working night shifts on and off  His blood sugars are averaging mostly around 150-170 with some variability during the afternoon and evening hours  He is tending to have mostly higher glucoses recently in the night hours but he tries to avoid hypoglycemia while at work because of activity levels  Although his blood sugars were mostly higher between 2 PM-6 PM previously they  are improving recently probably because he has increased his basal rate acutely aft after 2 PM  Hypoglycemia: This tends to occur sporadically at all different times but mostly between about 7 PM-8 PM  He tries to suspend his pump when he is more active at work and also at times in the daytime on weekends when he is active and blood sugars are near normal  He has had the threshold suspend occurring almost everyday except in the last week when this has occurred only once; duration of his threshold suspend has been variable, lasting from about 1 hour to as long as 5 hours  He is generally using manual boluses both for meals and for correction and is using bolus wizard only on a few days, generally once a day.  Otherwise has manual boluses about 6 times a day  Although he has tried to use the dual wave bolus for high fat meals like pizza this has not been done much recently.  He thinks this is usually when he is eating out at [employer] also  CGM RECORD INTERPRETATION    Dates of Recording: 04/09/14 through 04/22/14          Sensor  summary:  Quality of the data is excellent with adequate sensor function.  He has had 35 hyperglycemic episodes and 8 hypoglycemic episodes.  He  has had 2.8 low sugar alarms and 4.9 high sugar alarms per day Average glucose for the five-day recording is 155+/-63  Most common Event preceding hyperglycemia is basal rate decrease and also frequently had rising sensor rate of change without bolus Most common event preceding HYPOGLYCEMIA is preceding hyperglycemia.       Glycemic patterns:   overall his blood sugars are averaging not much above or below 140 with tendency to higher readings before noon and also between about 3-6 PM and to some degree after 10 PM.  However there is significant variability in the afternoons and evenings Hypoglycemia is occurring sporadically at various times with the longest episode between midnight-3 AM which occurred on 4/21 after  correcting high blood sugar the night before      Overnight periods:   not clear which are periods are overnight Since he is sometimes working during the night      Preprandial periods:   these are also difficult to identify because of not entering carbohydrates in his pump      Postprandial periods:   has occasional postprandial hyperglycemia as seen when he is bolusing but since he has multiple boluses throughout the day and not clear which boluses are related to main meals.  These patterns are inconsistent      Hypoglycemia:  as above hypoglycemia is occurring at all different times resulting in threshold suspend, most often in 7-8 PM        Lab Results  Component Value Date   HGBA1C 7.0* 04/23/2014   HGBA1C 7.7* 12/03/2013   HGBA1C 6.9* 04/29/2013   Lab Results  Component Value Date   MICROALBUR <0.7 04/23/2014   LDLCALC 72 12/03/2013   CREATININE 1.36 04/23/2014     Office Visit on 04/23/2014  Component Date Value Ref Range Status  . Hgb A1c MFr Bld 04/23/2014 7.0* 4.6 - 6.5 % Final   Glycemic Control Guidelines for People with Diabetes:Non Diabetic:  <6%Goal of Therapy: <7%Additional Action Suggested:  >8%   . Sodium 04/23/2014 135  135 - 145 mEq/L Final  . Potassium 04/23/2014 4.7  3.5 - 5.1 mEq/L Final  . Chloride 04/23/2014 101  96 - 112 mEq/L Final  . CO2 04/23/2014 27  19 - 32 mEq/L Final  . Glucose, Bld 04/23/2014 191* 70 - 99 mg/dL Final  . BUN 16/10/960404/21/2016 20  6 - 23 mg/dL Final  . Creatinine, Ser 04/23/2014 1.36  0.40 - 1.50 mg/dL Final  . Total Bilirubin 04/23/2014 2.0* 0.2 - 1.2 mg/dL Final  . Alkaline Phosphatase 04/23/2014 68  39 - 117 U/L Final  . AST 04/23/2014 24  0 - 37 U/L Final  . ALT 04/23/2014 27  0 - 53 U/L Final  . Total Protein 04/23/2014 7.0  6.0 - 8.3 g/dL Final  . Albumin 54/09/811904/21/2016 4.3  3.5 - 5.2 g/dL Final  . Calcium 14/78/295604/21/2016 9.8  8.4 - 10.5 mg/dL Final  . GFR 21/30/865704/21/2016 62.54  >60.00 mL/min Final  . Microalb, Ur 04/23/2014 <0.7  0.0 -  1.9 mg/dL Final  . Creatinine,U 84/69/629504/21/2016 91.0   Final  . Microalb Creat Ratio 04/23/2014 0.8  0.0 - 30.0 mg/g Final      Medication List       This list is accurate as of: 04/23/14  8:50 PM.  Always use your most recent med list.               atorvastatin 10 MG tablet  Commonly known as:  LIPITOR  TAKE 1 TABLET DAILY     glucose blood test strip  Commonly known as:  BAYER CONTOUR NEXT TEST  Use as instructed to check blood sugar 7-8 times per day dx code E10.65     insulin aspart 100 UNIT/ML injection  Commonly known as:  NOVOLOG  Use 50 units max per day with pump     lamoTRIgine 100 MG tablet  Commonly known as:  LAMICTAL  Take by mouth.        Allergies: No Known Allergies  Past Medical History  Diagnosis Date  . Seizures since 2008    No past surgical history on file.  Family History  Problem Relation Age of Onset  . Diabetes Neg Hx   . Heart disease Neg Hx     Social History:  reports that he has never smoked. He has never used smokeless tobacco. His alcohol and drug histories are not on file.  REVIEW of systems:  No history of hypertension  Has been on Lipitor for hypercholesterolemia   Lab Results  Component Value Date   CHOL 126 12/03/2013   HDL 47.00 12/03/2013   LDLCALC 72 12/03/2013   TRIG 35.0 12/03/2013   CHOLHDL 3 12/03/2013     No numbness or tingling in his feet.  Last diabetic foot exam in 12/15  No retinopathy on his last exam  EXAM:  BP 116/78 mmHg  Pulse 75  Temp(Src) 98.3 F (36.8 C)  Resp 14  Ht 5\' 8"  (1.727 m)  Wt 186 lb 3.2 oz (84.46 kg)  BMI 28.32 kg/m2  SpO2 97%    ASSESSMENT:  See history of present illness for detailed analysis of blood sugars and discussion of day-to-day management and problem areas    Blood sugar control as judged by his continuous glucose monitoring is overall fairly good with the sensor average of 155 recently He was having much higher readings in the afternoons for no apparent  reason but this is better when he adjusted his basal rates There is significant amount of propensity for hypoglycemia as judged by the frequent episodes of threshold suspend office pump although less so in the last few days; this may be utilized better by changing the threshold suspend to 70 which will avoid more significant hypoglycemia  Since he has recently adjusted his basal rates on his own will not change them again Also difficult to propose a different basal rate for his night shifts since difficult to analyze which nights he is working  Discussed trying to use the bolus wizard to help calculate a correction bolus  for his high readings as he reportedly gets low sugars from correcting high readings Also he needs to use a higher carb ratio for high-fat foods like pizza and then follow-up with additional boluses of needed We will check his A1c to assess overall control  He will also try to review his blood sugar patterns on his days off when he is sleeping during the day and may need to adjust the basal rates if needed  Counseling time over 50% of today's 25 minute visit  Patient Instructions  May try to use a temporary basal of about 25-40% when starting to be more active at work and blood sugar is near-normal.  Can extend  temporary basal for several hours  May try to use a carb ratio 1:10 for foods like pizza to prevent rapid rise in blood sugar  Change threshold suspension to 70  If blood sugars are getting consistently low when  you are resting during the day may need to use a different set of basal rates for those days     [facility] 04/23/2014, 8:50 PM

## 2014-04-23 NOTE — Patient Instructions (Signed)
May try to use a temporary basal of about 25-40% when starting to be more active at work and blood sugar is near-normal.  Can extend  temporary basal for several hours  May try to use a carb ratio 1:10 for foods like pizza to prevent rapid rise in blood sugar  Change threshold suspension to 70  If blood sugars are getting consistently low when you are resting during the day may need to use a different set of basal rates for those days

## 2014-05-19 ENCOUNTER — Encounter: Payer: Self-pay | Admitting: Endocrinology

## 2014-05-20 ENCOUNTER — Other Ambulatory Visit: Payer: Self-pay | Admitting: *Deleted

## 2014-05-20 MED ORDER — INSULIN ASPART 100 UNIT/ML ~~LOC~~ SOLN: SUBCUTANEOUS | Status: DC

## 2014-07-27 ENCOUNTER — Encounter: Payer: Self-pay | Admitting: Endocrinology

## 2014-07-27 ENCOUNTER — Other Ambulatory Visit: Payer: Self-pay

## 2014-07-27 MED ORDER — INSULIN ASPART 100 UNIT/ML ~~LOC~~ SOLN: SUBCUTANEOUS | Status: DC

## 2014-07-31 ENCOUNTER — Encounter: Payer: Self-pay | Admitting: Endocrinology

## 2014-08-27 ENCOUNTER — Encounter: Payer: Self-pay | Admitting: Endocrinology

## 2014-08-27 ENCOUNTER — Ambulatory Visit (INDEPENDENT_AMBULATORY_CARE_PROVIDER_SITE_OTHER): Payer: BLUE CROSS/BLUE SHIELD | Admitting: Endocrinology

## 2014-08-27 VITALS — BP 138/82 | HR 83 | Temp 97.9°F | Resp 16 | Ht 68.0 in | Wt 190.6 lb

## 2014-08-27 DIAGNOSIS — E785 Hyperlipidemia, unspecified: Secondary | ICD-10-CM | POA: Diagnosis not present

## 2014-08-27 DIAGNOSIS — E1065 Type 1 diabetes mellitus with hyperglycemia: Secondary | ICD-10-CM | POA: Diagnosis not present

## 2014-08-27 DIAGNOSIS — IMO0002 Reserved for concepts with insufficient information to code with codable children: Secondary | ICD-10-CM

## 2014-08-27 LAB — BASIC METABOLIC PANEL
BUN: 22 mg/dL (ref 6–23)
CO2: 28 mEq/L (ref 19–32)
Calcium: 9.3 mg/dL (ref 8.4–10.5)
Chloride: 100 mEq/L (ref 96–112)
Creatinine, Ser: 1.49 mg/dL (ref 0.40–1.50)
GFR: 56.18 mL/min — ABNORMAL LOW (ref >60.00)
Glucose, Bld: 299 mg/dL — ABNORMAL HIGH (ref 70–99)
Potassium: 4 mEq/L (ref 3.5–5.1)
Sodium: 134 mEq/L — ABNORMAL LOW (ref 135–145)

## 2014-08-27 LAB — POCT GLYCOSYLATED HEMOGLOBIN (HGB A1C): Hemoglobin A1C: 6.4

## 2014-08-27 NOTE — Progress Notes (Signed)
Tommy Newman is a 39 y.o. male.              Reason for Appointment: Insulin Pump followup:   History of Present Illness   Diagnosis: Type 1 DIABETES MELITUS, date of diagnosis: 1991      DIABETES history: He has been on an insulin pump since 1997  CURRENT insulin pump:  Medtronic 530 G-551  Overall he has had fairly good control but has tendency to  labile blood sugars  A1c is generally in the 7-7.5 range but now it is down to 6.4% without any hypoglycemia  Control is somewhat difficult because of his working 12 hour shift and periodically will work night shifts also Previously he was advised to use a different basal rate on his work nights but he apparently does not know how to program his pump to do this He generally tries to adjust his basal rate based on his blood sugar pattern. Since his last visit he has ordered his basal rate significantly again on his own including the times  He has continued using the continuous glucose monitoring and is benefiting from using the threshold suspend feature  The pump SETTINGS are: Basal rate: Midnight = 1.0, 3 AM = 1.25, 5:30 AM = 0.75, 10:30 AM = 1.3  2 PM = 2.05.  4 PM = 1.25 and 9 PM = 1.3    Carb Ratio: 1: 15 Correction factor 1: 30 with target 90-125  GLUCOSE CONTROL with the pump is assessed today by pump and continuous glucose monitoring download  Current blood sugar patterns and current management:  Difficult to assess his wake and sleep hour pattern since he is working night shifts on and off  Blood sugars by fingerstick testing are variable but tend to be higher around 5-6 PM and sporadically during the night hours also  Fingerstick blood sugar testing indicates an average of 154+/-63  He is generally doing about 4 manual boluses per day and only on an average 1 bolus wizard event, mostly with correction  Mealtime boluses: He has tried to use the dual wave bolus for high fat meals only occasionally and mostly with  [place] food  HYPOGLYCEMIA: This tends to occur sporadically at all different times but mostly transiently but mostly between about 1 AM-11 AM and occasionally in the afternoon  Threshold suspend is occurring almost everyday for interval durations, as long as 3-1/2 hours, generally once a day and average duration 2 hours per day  His lowest blood sugars appear to be at the time when he has the highest basal rate of 2.05 in the early afternoon but he does not report hypoglycemia usually at that time or excessive threshold suspend  Blood sugars may be overall relatively high during the night hours at work but he likes to keep his blood sugar is slightly high because of his potential for low sugars with activity at work    CGM RECORD INTERPRETATION    Dates of Recording: 08/03/14 through 08/27/14  Sensor  summary:  Average blood glucose for the period of recording is 139 with standard deviation  51      Glycemic patterns:  Hyperglycemic episodes are occurring more often in the late afternoon and some overnight.  These are mostly preceded by a rising Sensor rate without bolus.  Occasionally tends to have a rebound following is low normal blood sugar Hypoglycemic episodes occurred 18 times according to the current report, mostly preceded by basal rate increase or preceded by hyperglycemia  Overnight periods:   blood sugar during the night hours or variable and can range in the hypoglycemic zone up to over 200 with a slight increase at 1 AM and also 4-5 AM but otherwise averaging about 150      Preprandial periods:   unable to identify mealtimes as he does not enter carbohydrates and has some boluses throughout the day, however her blood sugars are overall the lowest around 3-4 PM      Postprandial periods:  these are also difficult to identify because of not entering carbohydrates in his pump but review of his day today boluses generally do not show any spikes following meals, these occur only  occasionally      Hypoglycemia:  although he has tend towards low sugars at various times as above he has not correlated this with fingersticks except for occasional readings around 60 and most of his hypoglycemia appears to be easily reported by threshold suspend          Lab Results  Component Value Date   HGBA1C 6.4 08/27/2014   HGBA1C 7.0* 04/23/2014   HGBA1C 7.7* 12/03/2013   Lab Results  Component Value Date   MICROALBUR <0.7 04/23/2014   LDLCALC 72 12/03/2013   CREATININE 1.49 08/27/2014         Medication List       This list is accurate as of: 08/27/14 11:59 PM.  Always use your most recent med list.               atorvastatin 10 MG tablet  Commonly known as:  LIPITOR  TAKE 1 TABLET DAILY     glucose blood test strip  Commonly known as:  BAYER CONTOUR NEXT TEST  Use as instructed to check blood sugar 7-8 times per day dx code E10.65     insulin aspart 100 UNIT/ML injection  Commonly known as:  NOVOLOG  Use 50 units max per day with pump     lamoTRIgine 100 MG tablet  Commonly known as:  LAMICTAL  Take by mouth.        Allergies: No Known Allergies  Past Medical History  Diagnosis Date  . Seizures since 2008    No past surgical history on file.  Family History  Problem Relation Age of Onset  . Diabetes Neg Hx   . Heart disease Neg Hx     Social History:  reports that he has never smoked. He has never used smokeless tobacco. His alcohol and drug histories are not on file.  REVIEW of systems:  No history of hypertension  Has been on Lipitor for hypercholesterolemia but recently ran out, no recent labs available   Lab Results  Component Value Date   CHOL 126 12/03/2013   HDL 47.00 12/03/2013   LDLCALC 72 12/03/2013   TRIG 35.0 12/03/2013   CHOLHDL 3 12/03/2013     No numbness or tingling in his feet.  Last diabetic foot exam in 12/15  No retinopathy on his last exam  EXAM:  BP 138/82 mmHg  Pulse 83  Temp(Src) 97.9 F  (36.6 C)  Resp 16  Ht  (1.727 m)  Wt 190 lb 9.6 oz (86.456 kg)  BMI 28.99 kg/m2  SpO2 96%   ASSESSMENT:  See history of present illness for detailed analysis of blood sugars and discussion of day-to-day management and problem areas   His A1c is now down to 6.4% which is better than usual He does not have any consistent patterns of  high or low sugars and hypoglycemia has been minimal and generally avoided by the threshold suspend feature Recommendations made today:  Start using pattern A for basal rate for his work nights and he can start by reducing his midnight basal to 0.9, 1:30 a.m. to 0.85 and 3 AM to 1.1.  This was unable today and showed him how to program the new settings  Reminded him to use a higher carb ratio for high-fat foods like pizza and then follow-up with additional boluses of needed; he can still use the dual wave bolus for foods that tend to cause prolonged hyperglycemia  Discussed trying to use the bolus wizard to help calculate a correction bolus  for his high readings  He will continue to adjust his basal rates based on his tendency to low or high readings at different times of day or night; may consider reducing the basal rate at 2 PM  Encouraged him to bolus more often when the blood sugar tends to be arising  HYPERCHOLESTEROLEMIA: Will consider restarting his Lipitor, will need to check his lipids on the next visit, baseline lipids have not been excessively high  Counseling time on subjects discussed above is over 50% of today's 25 minute visit  Tommy Newman 08/31/2014, 8:38 AM

## 2014-11-05 ENCOUNTER — Encounter: Payer: Self-pay | Admitting: Endocrinology

## 2014-11-05 ENCOUNTER — Other Ambulatory Visit: Payer: Self-pay | Admitting: *Deleted

## 2014-11-05 MED ORDER — INSULIN ASPART 100 UNIT/ML ~~LOC~~ SOLN: SUBCUTANEOUS | Status: DC

## 2015-01-28 ENCOUNTER — Encounter: Payer: Self-pay | Admitting: Endocrinology

## 2015-01-28 ENCOUNTER — Other Ambulatory Visit: Payer: Self-pay | Admitting: *Deleted

## 2015-01-28 MED ORDER — INSULIN ASPART 100 UNIT/ML ~~LOC~~ SOLN: SUBCUTANEOUS | Status: DC

## 2015-03-01 ENCOUNTER — Encounter: Payer: Self-pay | Admitting: Endocrinology

## 2015-03-01 ENCOUNTER — Ambulatory Visit (INDEPENDENT_AMBULATORY_CARE_PROVIDER_SITE_OTHER): Payer: BLUE CROSS/BLUE SHIELD | Admitting: Endocrinology

## 2015-03-01 VITALS — BP 124/78 | HR 77 | Temp 98.1°F | Resp 14 | Ht 68.0 in | Wt 205.0 lb

## 2015-03-01 DIAGNOSIS — R5383 Other fatigue: Secondary | ICD-10-CM | POA: Diagnosis not present

## 2015-03-01 DIAGNOSIS — E1065 Type 1 diabetes mellitus with hyperglycemia: Secondary | ICD-10-CM | POA: Diagnosis not present

## 2015-03-01 DIAGNOSIS — IMO0002 Reserved for concepts with insufficient information to code with codable children: Secondary | ICD-10-CM

## 2015-03-01 LAB — TSH: TSH: 1.55 u[IU]/mL (ref 0.35–4.50)

## 2015-03-01 LAB — LIPID PANEL
Cholesterol: 187 mg/dL (ref 0–200)
HDL: 60.3 mg/dL (ref >39.00)
LDL Cholesterol: 106 mg/dL — ABNORMAL HIGH (ref 0–99)
NonHDL: 126.4
Total CHOL/HDL Ratio: 3
Triglycerides: 103 mg/dL (ref 0.0–149.0)
VLDL: 20.6 mg/dL (ref 0.0–40.0)

## 2015-03-01 LAB — COMPREHENSIVE METABOLIC PANEL
ALT: 26 U/L (ref 0–53)
AST: 19 U/L (ref 0–37)
Albumin: 4.3 g/dL (ref 3.5–5.2)
Alkaline Phosphatase: 69 U/L (ref 39–117)
BUN: 26 mg/dL — ABNORMAL HIGH (ref 6–23)
CO2: 27 mEq/L (ref 19–32)
Calcium: 9.5 mg/dL (ref 8.4–10.5)
Chloride: 101 mEq/L (ref 96–112)
Creatinine, Ser: 1.39 mg/dL (ref 0.40–1.50)
GFR: 60.7 mL/min (ref >60.00)
Glucose, Bld: 277 mg/dL — ABNORMAL HIGH (ref 70–99)
Potassium: 4.3 mEq/L (ref 3.5–5.1)
Sodium: 134 mEq/L — ABNORMAL LOW (ref 135–145)
Total Bilirubin: 0.8 mg/dL (ref 0.2–1.2)
Total Protein: 6.9 g/dL (ref 6.0–8.3)

## 2015-03-01 LAB — T4, FREE: Free T4: 0.86 ng/dL (ref 0.60–1.60)

## 2015-03-01 LAB — MICROALBUMIN / CREATININE URINE RATIO
Creatinine,U: 46 mg/dL
Microalb Creat Ratio: 1.5 mg/g (ref 0.0–30.0)
Microalb, Ur: <0.7 mg/dL (ref 0.0–1.9)

## 2015-03-01 LAB — POCT GLYCOSYLATED HEMOGLOBIN (HGB A1C): Hemoglobin A1C: 6.3

## 2015-03-01 NOTE — Addendum Note (Signed)
Addended by: Adline Mango I on: 03/01/2015 03:48 PM   Modules accepted: Orders

## 2015-03-01 NOTE — Patient Instructions (Signed)
Check with [facility] on alternative to Lamictal

## 2015-03-01 NOTE — Progress Notes (Signed)
Tommy Newman is a 40 y.o. male.              Reason for Appointment: Insulin Pump followup:   History of Present Illness   Diagnosis: Type 1 DIABETES MELITUS, date of diagnosis: 1991      DIABETES history: He has been on an insulin pump since 1997  CURRENT insulin pump:  Medtronic 530 G-551  Overall he has had fairly good control but has tendency to  labile blood sugars  A1c is generally in the 7-7.5 range but now it is consistently below 6.5%  Control is somewhat difficult because of his working 12 hour shift and mostly at night Previously he was advised to use a different basal rate on his work nights but he apparently did not find this useful and is using the same basal rate daily He generally tries to adjust his basal rate based on his blood sugar pattern and his basal rates looked quite different compared to the last time. Since his last visit he has ordered his basal rate significantly again on his own including the times  He has continued using the continuous glucose monitoring and is benefiting from using the threshold suspend feature  The pump SETTINGS are:  Basal rate: Midnight = 1.10, 1:30 AM = 0.875, 2 AM = 1.0, 4:30 AM = 1.25, 6:30 AM = 0.9; 9:30 AM = 0.65,  1:30 PM = 1.85.  7:30 p.m. = 2.05     Carb Ratio: 1: 15 Correction factor 1: 30 with target 90-125  GLUCOSE CONTROL with the pump is assessed today by pump and continuous glucose monitoring download  Current blood sugar patterns, problems identified and current management:  Difficult to assess his wake and sleep hour pattern since he is working night shifts   Blood sugars by fingerstick testing are variable without any persistent trend  He thinks his blood sugars have been higher in the evening and raised his basal rate at 7:30 PM last evening.  He had an unusual event with low blood sugar while sleeping during the day on Friday and blood sugar rebounded very significantly after this  He is concerned  about his weight gain but is not getting excessive hyperglycemia  He does not think he is getting a large amount of carbohydrates and his meals are not high fat meals  He does tend to override his boluses at times based on what he is doing  Since his work activity level is variable he does not like to use a different basal rate at night  He does try to suspend his pump for about an hour when he goes for exercise usually with good results  Mean values apply above for all meters except median for One Touch  PRE-MEAL  morning  Lunch Dinner Bedtime Overall  Glucose range:  80-263     62-400+    Mean/median:  155     174   159+/-83     CONTINUOUS GLUCOSE MONITORING RECORD INTERPRETATION    Dates of Recording: 02/15/15 through 02/28/15  Sensor  summary:  Average blood glucose for the period of recording is 157 with standard deviation  62.  He is using it on an average nearly 6 days a week Currently getting 3.7 high alarms per day and 1.3 low alarms per day      Glycemic patterns:  Hyperglycemic episodes occurred 42 times mostly preceded by basal rate decrease  Hypoglycemic episodes occurred 7 times mostly preceded by rapid following the Sensor rate  of change      Overnight periods:   his blood sugars are on an average between 130-170 and only occasionally on the high zone with the couple of peaks occurring around 4 AM.  Blood sugar may be low normal only once      Preprandial periods:   difficult to identify as he does not enter his carbohydrates in the pump      Postprandial periods:   these do not show any consistent hyperglycemia except rarely during the night when he is working.  He did appear to have significant hypoglycemia following his lunch boluses last Friday, unclear what he had for his meal      Hypoglycemia:  rarely and occurring only once at about 11 AM and another time 2 PM causing threshold suspend, may be occurring after a bolus          Wt Readings from Last 3  Encounters:  03/01/15 205 lb (92.987 kg)  08/27/14 190 lb 9.6 oz (86.456 kg)  04/23/14 186 lb 3.2 oz (84.46 kg)    Lab Results  Component Value Date   HGBA1C 6.4 08/27/2014   HGBA1C 7.0* 04/23/2014   HGBA1C 7.7* 12/03/2013   Lab Results  Component Value Date   MICROALBUR <0.7 04/23/2014   LDLCALC 72 12/03/2013   CREATININE 1.49 08/27/2014         Medication List       This list is accurate as of: 03/01/15 10:40 AM.  Always use your most recent med list.               atorvastatin 10 MG tablet  Commonly known as:  LIPITOR  TAKE 1 TABLET DAILY     glucose blood test strip  Commonly known as:  BAYER CONTOUR NEXT TEST  Use as instructed to check blood sugar 7-8 times per day dx code E10.65     insulin aspart 100 UNIT/ML injection  Commonly known as:  NOVOLOG  Use 50 units max per day with pump     lamoTRIgine 100 MG tablet  Commonly known as:  LAMICTAL  Take by mouth.        Allergies: No Known Allergies  Past Medical History  Diagnosis Date  . Seizures (HCC) since 2008    No past surgical history on file.  Family History  Problem Relation Age of Onset  . Diabetes Neg Hx   . Heart disease Neg Hx     Social History:  reports that he has never smoked. He has never used smokeless tobacco. His alcohol and drug histories are not on file.  REVIEW of systems:  He is concerned about his recent weight gain.  He thinks he has been on Lamictal for a couple of years for seizure prophylaxis from a neurologist Sometimes will feel tired  Has been on Lipitor for hypercholesterolemia but has not taken this for some time despite reminders Needs follow-up labs  Lab Results  Component Value Date   CHOL 126 12/03/2013   HDL 47.00 12/03/2013   LDLCALC 72 12/03/2013   TRIG 35.0 12/03/2013   CHOLHDL 3 12/03/2013     No numbness or tingling in his feet.  Last diabetic foot exam in 12/15  No retinopathy on his last exam in 2015  EXAM:  BP 124/78 mmHg   Pulse 77  Temp(Src) 98.1 F (36.7 C)  Resp 14  Ht 5\' 8"  (1.727 m)  Wt 205 lb (92.987 kg)  BMI 31.18 kg/m2  SpO2 97%  Thyroid just palpable on the right side, soft Reflexes are normal  ASSESSMENT:  See history of present illness for detailed analysis of blood sugars, continuous glucose monitoring and discussion of day-to-day management and problems identified His A1c is now down to 6.3% which is consistent without any excessive hypoglycemia He does not have any consistent patterns of high or low sugars As discussed above most of his hyperglycemic events are related to basal rate decrease but he tends to change his basal rate every couple of days on his own Since on an average he has fairly stable blood sugars throughout the day and night will not suggest any changes as yet Recommendations made today:  Try to change basal rates only every 3 days or so  He will look into the new Medtronic pump with the close glucose system  He would benefit from entering his carbohydrates in the pump  Scheduled follow-up eye exam  Check thyroid levels for weight gain and independent fatigue, small goiter present  HYPERCHOLESTEROLEMIA: Will consider restarting his Lipitor, will need to check his lipids as he has not taken this lately   Counseling time on subjects discussed above is over 50% of today's 25 minute visit  Marylyn Appenzeller 03/01/2015, 10:40 AM

## 2015-03-01 NOTE — Addendum Note (Signed)
Addended by: Hermenia Bers on: 03/01/2015 11:41 AM   Modules accepted: Orders

## 2015-03-03 ENCOUNTER — Encounter: Payer: Self-pay | Admitting: Endocrinology

## 2015-03-03 NOTE — Progress Notes (Signed)
Quick Note:  Please let patient know that the thyroid levels are normal, cholesterol is high. Recommend restarting Lipitor 10 mg daily, prescription needed. Also he will need to ask his neurologist if they can use another drug in place of Lamictal to prevent further weight gain ______

## 2015-03-04 ENCOUNTER — Other Ambulatory Visit: Payer: Self-pay | Admitting: *Deleted

## 2015-03-04 MED ORDER — ATORVASTATIN CALCIUM 10 MG PO TABS: 10.0000 mg | ORAL_TABLET | Freq: Every day | ORAL | Status: DC

## 2015-03-23 ENCOUNTER — Other Ambulatory Visit: Payer: Self-pay | Admitting: Endocrinology

## 2015-05-04 ENCOUNTER — Other Ambulatory Visit: Payer: Self-pay | Admitting: *Deleted

## 2015-05-04 ENCOUNTER — Encounter: Payer: Self-pay | Admitting: Endocrinology

## 2015-05-04 MED ORDER — INSULIN ASPART 100 UNIT/ML ~~LOC~~ SOLN: SUBCUTANEOUS | Status: DC

## 2015-05-05 ENCOUNTER — Encounter: Payer: Self-pay | Admitting: Endocrinology

## 2015-06-27 ENCOUNTER — Other Ambulatory Visit: Payer: Self-pay | Admitting: Endocrinology

## 2015-07-22 ENCOUNTER — Encounter: Payer: Self-pay | Admitting: Endocrinology

## 2015-07-23 ENCOUNTER — Other Ambulatory Visit: Payer: Self-pay

## 2015-07-23 MED ORDER — INSULIN ASPART 100 UNIT/ML ~~LOC~~ SOLN: SUBCUTANEOUS | Status: DC

## 2015-07-23 MED ORDER — ATORVASTATIN CALCIUM 10 MG PO TABS: ORAL_TABLET | ORAL | Status: DC

## 2015-07-25 ENCOUNTER — Encounter: Payer: Self-pay | Admitting: Endocrinology

## 2015-07-29 ENCOUNTER — Other Ambulatory Visit: Payer: Self-pay

## 2015-07-29 ENCOUNTER — Telehealth: Payer: Self-pay | Admitting: Endocrinology

## 2015-07-29 MED ORDER — GLUCOSE BLOOD VI STRP: ORAL_STRIP | 12 refills | Status: DC

## 2015-07-29 NOTE — Telephone Encounter (Signed)
Pt is asking if we can rx him the glucose strips for the one touch ultra to [facility]

## 2015-07-30 ENCOUNTER — Other Ambulatory Visit: Payer: Self-pay

## 2015-07-30 ENCOUNTER — Encounter: Payer: Self-pay | Admitting: Endocrinology

## 2015-07-30 NOTE — Telephone Encounter (Signed)
Sent test strips into pharmacy yesterday.

## 2015-07-30 NOTE — Telephone Encounter (Signed)
Patient is calling back about his medication, ask if he can get it done today,

## 2015-08-24 ENCOUNTER — Ambulatory Visit: Payer: BLUE CROSS/BLUE SHIELD | Admitting: Endocrinology

## 2015-08-29 ENCOUNTER — Encounter: Payer: Self-pay | Admitting: Endocrinology

## 2015-08-31 ENCOUNTER — Ambulatory Visit: Payer: BLUE CROSS/BLUE SHIELD | Admitting: Endocrinology

## 2015-09-02 NOTE — Telephone Encounter (Signed)
Pt is in need of novolog please

## 2015-09-03 ENCOUNTER — Other Ambulatory Visit: Payer: Self-pay

## 2015-09-07 NOTE — Telephone Encounter (Signed)
Pt sent a copy of the insurance card for [employer] please let pt know what else you need

## 2015-09-09 ENCOUNTER — Other Ambulatory Visit: Payer: Self-pay | Admitting: *Deleted

## 2015-09-09 ENCOUNTER — Ambulatory Visit: Payer: BLUE CROSS/BLUE SHIELD | Admitting: Endocrinology

## 2015-09-09 MED ORDER — INSULIN ASPART 100 UNIT/ML ~~LOC~~ SOLN: SUBCUTANEOUS | 1 refills | Status: DC

## 2015-09-14 ENCOUNTER — Ambulatory Visit: Payer: BLUE CROSS/BLUE SHIELD | Admitting: Endocrinology

## 2015-09-20 ENCOUNTER — Encounter: Payer: Self-pay | Admitting: Endocrinology

## 2015-09-20 ENCOUNTER — Telehealth: Payer: Self-pay | Admitting: Endocrinology

## 2015-09-20 ENCOUNTER — Other Ambulatory Visit: Payer: Self-pay | Admitting: *Deleted

## 2015-09-20 MED ORDER — INSULIN ASPART 100 UNIT/ML ~~LOC~~ SOLN: SUBCUTANEOUS | 1 refills | Status: DC

## 2015-09-20 NOTE — Telephone Encounter (Signed)
Pt is requesting call back please

## 2015-09-23 ENCOUNTER — Other Ambulatory Visit: Payer: Self-pay | Admitting: *Deleted

## 2015-09-23 MED ORDER — INSULIN ASPART 100 UNIT/ML ~~LOC~~ SOLN: SUBCUTANEOUS | 1 refills | Status: DC

## 2015-09-23 NOTE — Telephone Encounter (Signed)
Pt is asking for a call back

## 2015-10-26 ENCOUNTER — Ambulatory Visit (INDEPENDENT_AMBULATORY_CARE_PROVIDER_SITE_OTHER): Payer: BLUE CROSS/BLUE SHIELD | Admitting: Endocrinology

## 2015-10-26 ENCOUNTER — Encounter: Payer: Self-pay | Admitting: Endocrinology

## 2015-10-26 VITALS — BP 126/88 | HR 64 | Temp 98.2°F | Resp 14 | Ht 68.0 in | Wt 197.2 lb

## 2015-10-26 DIAGNOSIS — E1065 Type 1 diabetes mellitus with hyperglycemia: Secondary | ICD-10-CM

## 2015-10-26 DIAGNOSIS — Z23 Encounter for immunization: Secondary | ICD-10-CM

## 2015-10-26 LAB — COMPREHENSIVE METABOLIC PANEL
ALT: 37 U/L (ref 0–53)
AST: 25 U/L (ref 0–37)
Albumin: 4.5 g/dL (ref 3.5–5.2)
Alkaline Phosphatase: 66 U/L (ref 39–117)
BUN: 21 mg/dL (ref 6–23)
CO2: 30 mEq/L (ref 19–32)
Calcium: 9.7 mg/dL (ref 8.4–10.5)
Chloride: 102 mEq/L (ref 96–112)
Creatinine, Ser: 1.43 mg/dL (ref 0.40–1.50)
GFR: 58.54 mL/min — ABNORMAL LOW (ref >60.00)
Glucose, Bld: 99 mg/dL (ref 70–99)
Potassium: 3.8 mEq/L (ref 3.5–5.1)
Sodium: 138 mEq/L (ref 135–145)
Total Bilirubin: 1.6 mg/dL — ABNORMAL HIGH (ref 0.2–1.2)
Total Protein: 7.1 g/dL (ref 6.0–8.3)

## 2015-10-26 LAB — LIPID PANEL
Cholesterol: 136 mg/dL (ref 0–200)
HDL: 62.9 mg/dL (ref >39.00)
LDL Cholesterol: 67 mg/dL (ref 0–99)
NonHDL: 73.2
Total CHOL/HDL Ratio: 2
Triglycerides: 32 mg/dL (ref 0.0–149.0)
VLDL: 6.4 mg/dL (ref 0.0–40.0)

## 2015-10-26 LAB — POCT GLYCOSYLATED HEMOGLOBIN (HGB A1C): Hemoglobin A1C: 6.6

## 2015-10-26 NOTE — Progress Notes (Signed)
Tommy Newman is a 40 y.o. male.              Reason for Appointment: Insulin Pump followup:   History of Present Illness   Diagnosis: Type 1 DIABETES MELITUS, date of diagnosis: 1991      DIABETES history: He has been on an insulin pump since 1997  CURRENT insulin pump:  Medtronic 530 G-551  Overall he has had fairly good control but has tendency to  labile blood sugars  A1c is generally in the 7-7.5 range but now it is consistently below 6.5%  Control is somewhat difficult because of his working 12 hour shift and mostly at night Previously he was advised to use a different basal rate on his work nights but he apparently did not find this useful and is using the same basal rate daily  Since his last visit he has ordered his basal rate significantly again on his own including the times  He has continued using the continuous glucose monitoring and is benefiting from using the threshold suspend feature  The pump SETTINGS are:  Basal rates are very numerous with the lowest basal rate of 0.40 at 3:30 AM and highest basal rate of 1.55 at midnight is also 1.50 from 4 PM-midnight Total basal insulin currently 30.6 units    Carb Ratio: 1: 15 Correction factor 1: 30 with target 90-125                                                                  GLUCOSE CONTROL with the pump is assessed today by pump and continuous glucose monitoring download  Current blood sugar patterns, problems identified and current management:  Difficult to assess his wake and sleep hour pattern since he is working night shifts   He continues to adjust his basal rate based on his blood sugar pattern and does not know when he made the last change  BASAL rate mid morning and early afternoon is much higher than before when he was having a basal rate of only 0.65 in the morning at 9 AM.  He has a tendency to low readings between 10 AM-1 PM and overall average blood sugar is the lowest at that time judged by  his CGM  AVERAGE blood sugar for more CGM is mostly in the 140-150 range throughout the day  Blood sugars tend to be the highest between about 7 AM-9 AM but not consistently, he does have a few fingersticks in the morning below 100  HYPOGLYCEMIA has been infrequent but appears to be getting low normal or low readings occasionally when trying to correct the postprandial high readings that he may get good  He has had to threshold SUSPEND almost daily   He will also suspend his pump manually at times was only when he is more active including during the night and possibly when his blood sugars trending lower  His fingersticks indicated periodically high readings in the evenings between 5-8 PM number to CGM indicates variable readings  Again difficult to assess his postprandial blood sugar control since he does not enter carbohydrates when he is eating  Usually able to control postprandial readings with his boluses  He does try to suspend his pump for about an hour when he goes for  exercise usually with good results  Mean values apply above for all meters except median for One Touch  PRE-MEAL A.m.  Lunch Dinner Bedtime Overall  Glucose range: 62-288  57-400+  88-277  64-184    Mean/median:     148+/-78      Wt Readings from Last 3 Encounters:  10/26/15 197 lb 3.2 oz (89.4 kg)  03/01/15 205 lb (93 kg)  08/27/14 190 lb 9.6 oz (86.5 kg)    Lab Results  Component Value Date   HGBA1C 6.3 03/01/2015   HGBA1C 6.4 08/27/2014   HGBA1C 7.0 (H) 04/23/2014   Lab Results  Component Value Date   MICROALBUR <0.7 03/01/2015   LDLCALC 106 (H) 03/01/2015   CREATININE 1.39 03/01/2015         Medication List       Accurate as of 10/26/15  8:38 AM. Always use your most recent med list.          atorvastatin 10 MG tablet Commonly known as:  LIPITOR TAKE 1 TABLET (10 MG TOTAL) BY MOUTH DAILY.   glucose blood test strip Commonly known as:  BAYER CONTOUR NEXT TEST Use as instructed  to check blood sugar 7-8 times per day dx code E10.65   glucose blood test strip Commonly known as:  ONE TOUCH ULTRA TEST Use as instructed; check 7-8 times daily.   insulin aspart 100 UNIT/ML injection Commonly known as:  NOVOLOG Inject subcutaneously 50  units maximum per day with  pump   lamoTRIgine 100 MG tablet Commonly known as:  LAMICTAL Take by mouth.       Allergies: No Known Allergies  Past Medical History:  Diagnosis Date  . Seizures (HCC) since 2008    No past surgical history on file.  Family History  Problem Relation Age of Onset  . Diabetes Neg Hx   . Heart disease Neg Hx     Social History:  reports that he has never smoked. He has never used smokeless tobacco. His alcohol and drug histories are not on file.  REVIEW of systems:   Has been on Lipitor for hypercholesterolemia And he thinks he is more regular on this now Needs follow-up labs  Lab Results  Component Value Date   CHOL 187 03/01/2015   HDL 60.30 03/01/2015   LDLCALC 106 (H) 03/01/2015   TRIG 103.0 03/01/2015   CHOLHDL 3 03/01/2015     No numbness or tingling in his feet.  Last diabetic foot exam in 10/17   No retinopathy on his last exam in 2015, has not made a follow-up appointment  He has lost weight that he had regained  EXAM:  BP 126/88   Pulse 64   Temp 98.2 F (36.8 C)   Resp 14   Ht 5\' 8"  (1.727 m)   Wt 197 lb 3.2 oz (89.4 kg)   SpO2 98%   BMI 29.98 kg/m      Diabetic Foot Exam - Simple   Simple Foot Form Diabetic Foot exam was performed with the following findings:  Yes 10/26/2015  9:04 AM  Visual Inspection No deformities, no ulcerations, no other skin breakdown bilaterally:  Yes Sensation Testing Intact to touch and monofilament testing bilaterally:  Yes Pulse Check Posterior Tibialis and Dorsalis pulse intact bilaterally:  Yes Comments     ASSESSMENT:  See history of present illness for detailed analysis of blood sugars, continuous glucose  monitoring and discussion of day-to-day management and problems identified His A1c is consistently good at  6.6 now  He continues to manage his pump on his own with frequent basal rate changes which are inconsistent from visit to visit Even with his having a very low basal rate during the night he tends to have low normal readings occasionally at 4 AM However has variability in his blood sugars more so in the afternoons based on a schedule  Overall appears to have good postprandial control but occasionally will have high readings He is tending to have low or low normal sugars if he does corrections for postprandial hyperglycemia Most consistently tending to be relatively low mid morning  Recommendations made today:  He will reduce his basal rate at 9:30 AM and 11 AM by 0.1  Try to change basal rates only every 3 days or so only when he has consistent patterns  He will look into the new 670 Medtronic pump and have given him his contact information and new brochure  He would need to avoid excessive bolusing for postprandial peaks of blood sugars  Continue to use square wave boluses for higher fat meals  Again discussed that he will benefit from entering his carbohydrates in the pump especially when going on the 6 70 pump  Scheduled  HYPERCHOLESTEROLEMIA: Will recheck labs  Counseling time on subjects discussed above is over 50% of today's 25 minute visit  Shakevia Sarris 10/26/2015, 8:38 AM

## 2015-10-26 NOTE — Patient Instructions (Signed)
Scheduled follow-up eye exam

## 2015-11-09 ENCOUNTER — Telehealth: Payer: Self-pay | Admitting: Endocrinology

## 2015-11-09 NOTE — Telephone Encounter (Signed)
Patient needs Dr. Lucianne Newman to inform the insurance company that Tommy Newman needs that new pump now that Dr. Lucianne Newman prescribed.  If Tommy Newman does that [employer]the insurance company will cover it.  If they don't hear from the doctor, the patient will not be covered for it until April of 2018.

## 2015-11-12 NOTE — Telephone Encounter (Signed)
Tommy Newman, please help with a letter to [facility], has labile Type 1

## 2015-11-29 ENCOUNTER — Other Ambulatory Visit: Payer: Self-pay

## 2015-11-29 ENCOUNTER — Encounter: Payer: Self-pay | Admitting: Endocrinology

## 2015-11-29 MED ORDER — INSULIN ASPART 100 UNIT/ML ~~LOC~~ SOLN: SUBCUTANEOUS | 1 refills | Status: DC

## 2015-11-30 NOTE — Telephone Encounter (Signed)
Linda: What is the status

## 2015-12-06 NOTE — Telephone Encounter (Signed)
Letter was sent to [facility] this weekend to put on your stationary for you to sign. Please sign and return to me to fax to [employer]

## 2016-01-05 NOTE — Telephone Encounter (Signed)
Megan: Please complete this

## 2016-01-05 NOTE — Telephone Encounter (Signed)
I have looked through my e-mail for this letter and I have not received the letter.

## 2016-01-25 ENCOUNTER — Other Ambulatory Visit: Payer: Self-pay | Admitting: Endocrinology

## 2016-01-26 LAB — HM DIABETES EYE EXAM

## 2016-02-15 ENCOUNTER — Telehealth: Payer: Self-pay | Admitting: Endocrinology

## 2016-02-15 NOTE — Telephone Encounter (Signed)
Tommy Newman, Patient ask you to call him.

## 2016-02-16 NOTE — Telephone Encounter (Signed)
Message left on machine to call me back.

## 2016-02-25 ENCOUNTER — Encounter: Payer: Self-pay | Admitting: Endocrinology

## 2016-02-29 ENCOUNTER — Other Ambulatory Visit: Payer: Self-pay

## 2016-02-29 MED ORDER — INSULIN LISPRO 100 UNIT/ML ~~LOC~~ SOLN: SUBCUTANEOUS | 3 refills | Status: DC

## 2016-03-14 ENCOUNTER — Encounter: Payer: Self-pay | Admitting: Endocrinology

## 2016-03-16 NOTE — Telephone Encounter (Signed)
Pt called in checking on the status of his email.  Please advise and call patient.

## 2016-03-17 NOTE — Telephone Encounter (Signed)
[employer] needs the order for the pump sensors for his current meter [phone]

## 2016-03-22 NOTE — Telephone Encounter (Signed)
[employer] calling on the status of fax for diabetes supplies.  They will refax form.

## 2016-03-23 NOTE — Telephone Encounter (Signed)
What is the status of the sensors refill

## 2016-03-27 NOTE — Telephone Encounter (Signed)
[employer] needs us to order the sensors for this pt please send it to the # below

## 2016-03-28 ENCOUNTER — Telehealth: Payer: Self-pay | Admitting: Endocrinology

## 2016-03-28 NOTE — Telephone Encounter (Signed)
If he is asking about his CGM the papers were signed today, please discuss further with him

## 2016-03-28 NOTE — Telephone Encounter (Signed)
Patient is requesting a refill on fensor. Patient states that he gets them by [employer] was not sure if it was [facility]. Please call patient to advise.

## 2016-03-29 NOTE — Telephone Encounter (Signed)
Left a detailed vm and requested a call back if he had any questions

## 2016-04-25 ENCOUNTER — Ambulatory Visit (INDEPENDENT_AMBULATORY_CARE_PROVIDER_SITE_OTHER): Payer: BLUE CROSS/BLUE SHIELD | Admitting: Endocrinology

## 2016-04-25 ENCOUNTER — Encounter: Payer: Self-pay | Admitting: Endocrinology

## 2016-04-25 ENCOUNTER — Ambulatory Visit: Payer: BLUE CROSS/BLUE SHIELD | Admitting: Endocrinology

## 2016-04-25 DIAGNOSIS — E1065 Type 1 diabetes mellitus with hyperglycemia: Secondary | ICD-10-CM | POA: Diagnosis not present

## 2016-04-25 LAB — BASIC METABOLIC PANEL
BUN: 13 mg/dL (ref 6–23)
CO2: 31 mEq/L (ref 19–32)
Calcium: 9.4 mg/dL (ref 8.4–10.5)
Chloride: 102 mEq/L (ref 96–112)
Creatinine, Ser: 1.23 mg/dL (ref 0.40–1.50)
GFR: 69.48 mL/min (ref >60.00)
Glucose, Bld: 125 mg/dL — ABNORMAL HIGH (ref 70–99)
Potassium: 4.2 mEq/L (ref 3.5–5.1)
Sodium: 137 mEq/L (ref 135–145)

## 2016-04-25 LAB — MICROALBUMIN / CREATININE URINE RATIO
Creatinine,U: 52 mg/dL
Microalb Creat Ratio: 1.3 mg/g (ref 0.0–30.0)
Microalb, Ur: <0.7 mg/dL (ref 0.0–1.9)

## 2016-04-25 LAB — POCT GLYCOSYLATED HEMOGLOBIN (HGB A1C): Hemoglobin A1C: 6.9

## 2016-04-25 NOTE — Progress Notes (Signed)
Tommy Newman is a 41 y.o. male.              Reason for Appointment: Insulin Pump followup:   History of Present Illness   Diagnosis: Type 1 DIABETES MELITUS, date of diagnosis: 1991      DIABETES history: He has been on an insulin pump since 1997  CURRENT insulin pump:  Medtronic 530 G-551  Overall he has had fairly good control but has tendency to variable blood sugar patterns   A1c is generally below 7%; It is now 6.9 compared to 6.6 previously  Control is somewhat difficult because of his working 12 hour shift and mostly at night Previously he was advised to use a different basal rate on his work nights but he apparently did not find this useful and is using the same basal rate daily  Since his last visit he has ordered his basal rate significantly again on his own including the times  He has continued using the continuous glucose monitoring and is benefiting from using the threshold suspend feature  The pump SETTINGS are:  He has several BASAL rates with the lowest basal rate of 0.85 at 5 AM until 7:30 AM, otherwise basal rates range between 1-2.0, highest basal rate 4-7:30 PM Total basal insulin currently 31.4 units, previously 30.6 units    Carb Ratio: 1: 15 Correction factor 1: 30 with target 90-125                                                                  GLUCOSE CONTROL with the pump is assessed today by pump and continuous glucose monitoring download  Current blood sugar patterns, problems identified and current management:  He is still not entering carbohydrates when his eating a meal; he confirmed that he is trying to use a 1:15 ratio when he calculates his boluses  He has increased his overnight basal rate significantly since his last visit as he thought his blood sugars are getting higher during the night  He may also occasionally use a temporary basal rate when he thinks his blood sugars are running higher  AVERAGE blood sugar as judged by  CGM is  mostly in the 140-150 range throughout the day; overall AVERAGE from his sensor is 160+/-57  He has about 33% of his readings over 140  Blood sugars tend to be the highest between about 8 AM-10 AM, usually at least a 140 and not always related to rebound from low sugars  HYPOGLYCEMIA has been infrequent with periodic threshold suspend is occurring mostly during the night hours when he is active at work and only once in the late afternoon possibly from exercise  He is able to detect hypoglycemic symptoms part of the time, lowest blood sugar on his fingersticks is 53  He does try to suspend his pump for about an hour when he goes for exercise usually with good results  Again POSTPRANDIAL readings are difficult to assess since he does not enter his carbohydrates for meals and snacks  He is usually not eating breakfast  Occasional high readings may be related to rebound  He has on some days suspend his pump as much as 3 hours, may potentially forget to restart his pump after suspending  He has gained a  little weight since his last visit, this fluctuates  Wt Readings from Last 3 Encounters:  04/25/16 202 lb (91.6 kg)  10/26/15 197 lb 3.2 oz (89.4 kg)  03/01/15 205 lb (93 kg)    Lab Results  Component Value Date   HGBA1C 6.6 10/26/2015   HGBA1C 6.3 03/01/2015   HGBA1C 6.4 08/27/2014   Lab Results  Component Value Date   MICROALBUR <0.7 03/01/2015   LDLCALC 67 10/26/2015   CREATININE 1.43 10/26/2015       Allergies as of 04/25/2016   No Known Allergies     Medication List       Accurate as of 04/25/16  9:32 AM. Always use your most recent med list.          atorvastatin 10 MG tablet Commonly known as:  LIPITOR TAKE 1 TABLET (10 MG TOTAL) BY MOUTH DAILY.   glucose blood test strip Commonly known as:  BAYER CONTOUR NEXT TEST Use as instructed to check blood sugar 7-8 times per day dx code E10.65   glucose blood test strip Commonly known as:  ONE TOUCH ULTRA  TEST Use as instructed; check 7-8 times daily.   insulin aspart 100 UNIT/ML injection Commonly known as:  NOVOLOG Inject subcutaneously 50  units maximum per day with  pump   insulin lispro 100 UNIT/ML injection Commonly known as:  HUMALOG Using for insulin pump- 50 units daily   lamoTRIgine 100 MG tablet Commonly known as:  LAMICTAL Take by mouth.       Allergies: No Known Allergies  Past Medical History:  Diagnosis Date  . Seizures (HCC) since 2008    No past surgical history on file.  Family History  Problem Relation Age of Onset  . Diabetes Neg Hx   . Heart disease Neg Hx     Social History:  reports that he has never smoked. He has never used smokeless tobacco. His alcohol and drug histories are not on file.  REVIEW of systems:   Has been on Lipitor for hypercholesterolemia   Lab Results  Component Value Date   CHOL 136 10/26/2015   HDL 62.90 10/26/2015   LDLCALC 67 10/26/2015   TRIG 32.0 10/26/2015   CHOLHDL 2 10/26/2015     No numbness or tingling in his feet.  Last diabetic foot exam in 10/17   No retinopathy on his last exam in 2015, has not made a follow-up appointment  He has lost weight that he had regained  EXAM:  BP 124/76   Pulse 69   Ht  (1.727 m)   Wt 202 lb (91.6 kg)   BMI 30.71 kg/m      Diabetic Foot Exam - Simple   No data filed      ASSESSMENT:  See history of present illness for detailed analysis of blood sugars, continuous glucose monitoring and discussion of day-to-day management and problems identified His A1c is consistently good And is now 6.9 A1c was better at 6.6 previously but he appears to be getting less hypoglycemia none  He was refused the 670 pump by his insurance despite recommendations to help him with better control especially with his swing shifts and tendency to hypoglycemia because of being active   Most consistently tending to be relatively low early morning and then highest blood sugars are  around 9 AM Again difficult to assess his postprandial patterns because of not entering carbohydrates; he does know that he needs to increase his boluses for higher fat meals Day-to-day  management with pump including management of boluses, hypoglycemia and use of sensors discussed today  Recommendations made today: He will reduce his basal rate at 4:30 AM down to 0.75 and increase basal rate at 7:30 AM up to 1.15 Encouraged him to enter carbohydrates when he is eating He will need to make sure he does not forget to restart his pump if he suspends this for exercise or other reasons Follow-up in 6 months again  Will request results of his eye exam to be sent  HYPERCHOLESTEROLEMIA: Will recheck labs on the next visit  Counseling time on subjects discussed above is over 50% of today's 25 minute visit  Alazne Quant 04/25/2016, 9:32 AM

## 2016-05-04 ENCOUNTER — Other Ambulatory Visit: Payer: Self-pay | Admitting: Endocrinology

## 2016-05-11 ENCOUNTER — Encounter: Payer: Self-pay | Admitting: Endocrinology

## 2016-05-15 ENCOUNTER — Telehealth: Payer: Self-pay | Admitting: Nutrition

## 2016-05-15 NOTE — Telephone Encounter (Signed)
Message left on my machine, saying that he got a letter from [employer], saying his pump is not out of warranty until 04/21/17, and he does not want to pay more for a new one.

## 2016-08-15 ENCOUNTER — Other Ambulatory Visit: Payer: Self-pay | Admitting: Endocrinology

## 2016-10-11 ENCOUNTER — Other Ambulatory Visit: Payer: Self-pay

## 2016-10-11 MED ORDER — INSULIN LISPRO 100 UNIT/ML ~~LOC~~ SOLN: SUBCUTANEOUS | 3 refills | Status: DC

## 2016-10-17 ENCOUNTER — Other Ambulatory Visit: Payer: Self-pay | Admitting: Endocrinology

## 2016-10-24 ENCOUNTER — Encounter: Payer: Self-pay | Admitting: Endocrinology

## 2016-10-24 ENCOUNTER — Ambulatory Visit (INDEPENDENT_AMBULATORY_CARE_PROVIDER_SITE_OTHER): Payer: BLUE CROSS/BLUE SHIELD | Admitting: Endocrinology

## 2016-10-24 ENCOUNTER — Encounter (INDEPENDENT_AMBULATORY_CARE_PROVIDER_SITE_OTHER): Payer: Self-pay

## 2016-10-24 VITALS — BP 130/84 | HR 73 | Ht 68.0 in | Wt 199.0 lb

## 2016-10-24 DIAGNOSIS — E1065 Type 1 diabetes mellitus with hyperglycemia: Secondary | ICD-10-CM

## 2016-10-24 DIAGNOSIS — E7800 Pure hypercholesterolemia, unspecified: Secondary | ICD-10-CM

## 2016-10-24 DIAGNOSIS — Z23 Encounter for immunization: Secondary | ICD-10-CM

## 2016-10-24 LAB — COMPREHENSIVE METABOLIC PANEL
ALT: 36 U/L (ref 0–53)
AST: 20 U/L (ref 0–37)
Albumin: 4.6 g/dL (ref 3.5–5.2)
Alkaline Phosphatase: 74 U/L (ref 39–117)
BUN: 19 mg/dL (ref 6–23)
CO2: 31 mEq/L (ref 19–32)
Calcium: 10.3 mg/dL (ref 8.4–10.5)
Chloride: 101 mEq/L (ref 96–112)
Creatinine, Ser: 1.15 mg/dL (ref 0.40–1.50)
GFR: 74.9 mL/min (ref >60.00)
Glucose, Bld: 124 mg/dL — ABNORMAL HIGH (ref 70–99)
Potassium: 3.6 mEq/L (ref 3.5–5.1)
Sodium: 138 mEq/L (ref 135–145)
Total Bilirubin: 1.3 mg/dL — ABNORMAL HIGH (ref 0.2–1.2)
Total Protein: 7.4 g/dL (ref 6.0–8.3)

## 2016-10-24 LAB — LIPID PANEL
Cholesterol: 133 mg/dL (ref 0–200)
HDL: 66 mg/dL (ref >39.00)
LDL Cholesterol: 59 mg/dL (ref 0–99)
NonHDL: 66.8
Total CHOL/HDL Ratio: 2
Triglycerides: 40 mg/dL (ref 0.0–149.0)
VLDL: 8 mg/dL (ref 0.0–40.0)

## 2016-10-24 LAB — POCT GLYCOSYLATED HEMOGLOBIN (HGB A1C): Hemoglobin A1C: 6.5

## 2016-10-24 NOTE — Progress Notes (Signed)
Tommy Newman is a 41 y.o. male.              Reason for Appointment: Insulin Pump followup:   History of Present Illness   Diagnosis: Type 1 DIABETES MELITUS, date of diagnosis: 1991      DIABETES history: He has been on an insulin pump since 1997  CURRENT insulin pump:  Medtronic 530 G-551  Overall he has had fairly good control but has tendency to variable blood sugar patterns   A1c is generally below 7%; It is now 6.5  Control is somewhat difficult because of his working 12 hour shift and mostly at night Previously he was advised to use a different basal rate on his work nights but he apparently did not find this useful and is using the same basal rate daily  Since his last visit he has ordered his basal rate significantly again on his own including the times  He has continued using the continuous glucose monitoring and is benefiting from using the threshold suspend feature  The pump SETTINGS are:  Mn 0.925, 3.30 am 0.925, 5 am 1.10; 8 am 1.75, 9.30 am 1.95; 10.30 1.80, 1 pm 1.20 2.30 pm 1.60; 4.30 pm 1.80, 8.30 2.0, 9.30:1.35     Carb Ratio: 1: 15 Correction factor 1: 30 with target 90-125                                                                  GLUCOSE CONTROL with the pump is assessed today by pump and continuous glucose monitoring download  Current blood sugar patterns, problems identified and current management:  Unable to download his pump today because of his battery not working and there is no data available  Currently checking the blood sugar with fingerstick about 6 times a day  His blood sugars are fluctuating at all times with standard deviation 72 but no consistent pattern  HYPOGLYCEMIA occurred sporadically with about 7% of his readings below 70, most often midmorning when his blood sugars are the lowest  Also sporadically will have a low blood sugar early morning or early afternoon  He has a relatively higher basal rate  of 1.95 between  9:30 and 10:30 AM when he may probably get low including yesterday when he was resting  Usually does not enter carbohydrates in his pump for his meal but he thinks he is using 1:15 ratio  Generally tries to eat low fat meal but tends to have high readings if he is eating out at [employer]  He is still not entering carbohydrates when his eating a meal; he confirmed that he is trying to use a 1:15 ratio when he calculates his boluses  He is using the same basal rate whether he is working or not, working either 3 or 4 days a week at night  Generally appears to be taking a higher basal rate from about 2:30 PM until 9:30 PM  Does not appear to have overcorrection for high readings but occasionally will get her rebound from low sugars  He thinks he still is able to get blood sugars control around exercise time by suspending his pump    Wt Readings from Last 3 Encounters:  10/24/16 199 lb (90.3 kg)  04/25/16 202 lb (91.6 kg)  10/26/15 197 lb 3.2 oz (89.4 kg)    Mean values apply above for all meters except median for One Touch  PRE-MEAL Morning  Lunch Dinner Overnight  Overall  Glucose range: 69-367   70-358  51-377    Mean/median: 148  138  134  118+/-80  135 +/-72    POST-MEAL PC Breakfast PC Lunch PC Dinner  Glucose range:   71-360   Mean/median:   158      Lab Results  Component Value Date   HGBA1C 6.5 10/24/2016   HGBA1C 6.9 04/25/2016   HGBA1C 6.6 10/26/2015   Lab Results  Component Value Date   MICROALBUR <0.7 04/25/2016   LDLCALC 59 10/24/2016   CREATININE 1.15 10/24/2016       Allergies as of 10/24/2016   No Known Allergies     Medication List       Accurate as of 10/24/16  2:11 PM. Always use your most recent med list.          atorvastatin 10 MG tablet Commonly known as:  LIPITOR TAKE 1 TABLET (10 MG TOTAL) BY MOUTH DAILY.   glucose blood test strip Commonly known as:  BAYER CONTOUR NEXT TEST Use as instructed to check blood sugar 7-8  times per day dx code E10.65   glucose blood test strip Commonly known as:  ONE TOUCH ULTRA TEST Use as instructed; check 7-8 times daily.   insulin lispro 100 UNIT/ML injection Commonly known as:  HUMALOG Using for insulin pump- 50 units daily   lamoTRIgine 100 MG tablet Commonly known as:  LAMICTAL Take by mouth.       Allergies: No Known Allergies  Past Medical History:  Diagnosis Date  . Seizures (HCC) since 2008    No past surgical history on file.  Family History  Problem Relation Age of Onset  . Diabetes Neg Hx   . Heart disease Neg Hx     Social History:  reports that he has never smoked. He has never used smokeless tobacco. His alcohol and drug histories are not on file.  REVIEW of systems:   Has been on Lipitor for hypercholesterolemia, needs follow-up   Lab Results  Component Value Date   CHOL 133 10/24/2016   HDL 66.00 10/24/2016   LDLCALC 59 10/24/2016   TRIG 40.0 10/24/2016   CHOLHDL 2 10/24/2016     No numbness or tingling in his feet.  Last diabetic foot exam in 10/17    No retinopathy on his last exam in 2018  He has lost weight that he had regained  BP Readings from Last 3 Encounters:  10/24/16 130/84  04/25/16 124/76  10/26/15 126/88     EXAM:  BP 130/84   Pulse 73   Ht 5\' 8"  (1.727 m)   Wt 199 lb (90.3 kg)   SpO2 95%   BMI 30.26 kg/m       ASSESSMENT:  See history of present illness for detailed analysis of blood sugars, continuous glucose monitoring and discussion of day-to-day management and problems identified  His A1c is consistently good and is now 6.5  Although he is checking his blood sugars frequently and also using the sensor blood sugar  Management  Is difficult to analyze as he did not have his pump available for download today  He does have significant variability but does not appear to have significant high postprandial readings which are difficult to identify Not clear why he has periodic blood sugars  over 300 also  He does have some tendency to hypoglycemia overnight and mid morning and only occasionally after supper  Day-to-day management with pump including management of boluses, hypoglycemia and use of sensor and is 670 pump was  discussed today  Recommendations made today: He will reduce his basal rate at  9:30 AM down to 1.8 He will look into the 670 pump in April when he is eligible Meanwhile he will try to work with that company to sort out issues with his sensor not lasting 10 days Discussed bolusing for higher fat meals Also if he is getting a rapid rise blood sugar after his meals he can consider Fiasp, he does not think this is a problem now   Follow-up in 6 months again   HYPERCHOLESTEROLEMIA: Will recheck labs today   High normal blood pressure: Will continue follow-up and he can check some readings at the drugstore also  Counseling time on subjects discussed above is over 50% of today's 25 minute visit  Melissa Tomaselli 10/24/2016, 2:11 PM

## 2016-10-24 NOTE — Progress Notes (Signed)
Please call to let patient know that the lab results are normal and no further action needed

## 2016-10-24 NOTE — Patient Instructions (Addendum)
Change 9.30 am from 1.95 down to 1.80  Extend bolus for [place] and add extra 30% more bolus

## 2016-10-25 NOTE — Telephone Encounter (Signed)
Pt has a question can you please call him back

## 2016-12-18 ENCOUNTER — Other Ambulatory Visit: Payer: Self-pay | Admitting: Endocrinology

## 2016-12-20 ENCOUNTER — Telehealth: Payer: Self-pay | Admitting: Endocrinology

## 2016-12-20 MED ORDER — INSULIN LISPRO 100 UNIT/ML ~~LOC~~ SOLN: SUBCUTANEOUS | 3 refills | Status: DC

## 2016-12-20 NOTE — Telephone Encounter (Signed)
Patient want to talk to you about his insulin. Please advise

## 2016-12-20 NOTE — Telephone Encounter (Signed)
I contacted the patient and he needed a refill to be sent to [facility] for his humalog vials. Refill submitted per patient's request and he had no further questions at this time.

## 2017-02-10 ENCOUNTER — Other Ambulatory Visit: Payer: Self-pay | Admitting: Endocrinology

## 2017-03-14 ENCOUNTER — Other Ambulatory Visit: Payer: Self-pay

## 2017-03-14 MED ORDER — INSULIN LISPRO 100 UNIT/ML ~~LOC~~ SOLN: SUBCUTANEOUS | 3 refills | Status: DC

## 2017-04-23 NOTE — Progress Notes (Signed)
Tommy Newman is a 42 y.o. male.              Reason for Appointment: Insulin Pump followup:   History of Present Illness   Diagnosis: Type 1 DIABETES MELITUS, date of diagnosis: 1991      DIABETES history: He has been on an insulin pump since 1997  CURRENT insulin pump:  Medtronic 530 G-551    A1c is generally below 7%; It is now 6.3, previously 6.5  His diabetes management his somewhat difficult because of his working 12 hour shift and mostly at night Previously he was advised to use a different basal rate on his work nights but he apparently did not find this useful and is using the same basal rate daily  He has continued using the continuous glucose monitoring although he thinks that occasionally this may not be accurate  The pump SETTINGS are: Changed frequent and has 10-14 different basal rates currently Highest basal rate is 2.0 between 3:30 PM up to 8:30 PM and lowest basal rate 1.0 at midnight and 4:30 AM Currently average daily basal is 32 units    Carb Ratio: 1: 15 Correction factor 1: 30 with target 90-125                                                                  GLUCOSE CONTROL with the pump is assessed today by pump and continuous glucose monitoring download  Current blood sugar patterns, problems identified and current management:  He says that he has had much more variability in blood sugars over the last month and he has been frequently adjusting his basal rate  For some reason that that the pump is not available except 8 days  As the usual variability in his blood sugars with standard deviation T on his sensor  AVERAGE on his sensor is 148  There is no consistent HYPERGLYCEMIC pattern except occasionally around 8 AM and 6 PM  HIGHEST blood sugars on average are around 6 PM and also 3-4 AM  Currently checking the blood sugar with fingerstick about 6 times a day  His blood sugars are fluctuating at all times with standard deviation 72 but no  consistent pattern  HYPOGLYCEMIA occurred most frequently between 10 AM-12 noon and also periodically around 1 AM; he has an unusually 5 basal rate of 1.6 between 10 AM-11:30 AM compared to the basal rates before and after  He has had threshold suspend occurring during the times of the hypoglycemia as above 3 episodes overnight and now no other 3 episodes midmorning  Since he does not enter CARBOHYDRATES in his pump not clear which boluses are for correction or high sugars  He has good control  around exercise time by suspending his pump    Wt Readings from Last 3 Encounters:  04/24/17 201 lb 3.2 oz (91.3 kg)  10/24/16 199 lb (90.3 kg)  04/25/16 202 lb (91.6 kg)   Mean values apply above for all meters except median for One Touch  PRE-MEAL  morning  midday Dinner  overnight Overall  Glucose range:  84-230  46-216  60-247  45-4   Mean/median:          Lab Results  Component Value Date   HGBA1C 6.3 04/24/2017  HGBA1C 6.5 10/24/2016   HGBA1C 6.9 04/25/2016   Lab Results  Component Value Date   MICROALBUR <0.7 04/25/2016   LDLCALC 59 10/24/2016   CREATININE 1.15 10/24/2016       Allergies as of 04/24/2017   No Known Allergies     Medication List        Accurate as of 04/24/17  9:29 AM. Always use your most recent med list.          atorvastatin 10 MG tablet Commonly known as:  LIPITOR TAKE 1 TABLET (10 MG TOTAL) BY MOUTH DAILY.   glucose blood test strip Commonly known as:  BAYER CONTOUR NEXT TEST Use as instructed to check blood sugar 7-8 times per day dx code E10.65   glucose blood test strip Commonly known as:  ONE TOUCH ULTRA TEST Use as instructed; check 7-8 times daily.   insulin lispro 100 UNIT/ML injection Commonly known as:  HUMALOG Using for insulin pump- 50 units daily   lamoTRIgine 100 MG tablet Commonly known as:  LAMICTAL Take 100 mg by mouth daily.       Allergies: No Known Allergies  Past Medical History:  Diagnosis Date  .  Seizures (HCC) since 2008    History reviewed. No pertinent surgical history.  Family History  Problem Relation Age of Onset  . Diabetes Neg Hx   . Heart disease Neg Hx     Social History:  reports that he has never smoked. He has never used smokeless tobacco. His alcohol and drug histories are not on file.  REVIEW of systems:   Has been on Lipitor for hypercholesterolemia, needs follow-up   Lab Results  Component Value Date   CHOL 133 10/24/2016   HDL 66.00 10/24/2016   LDLCALC 59 10/24/2016   TRIG 40.0 10/24/2016   CHOLHDL 2 10/24/2016     No numbness or tingling in his feet.  Last diabetic foot exam in 10/17    No retinopathy on his last exam in 2018    BP Readings from Last 3 Encounters:  04/24/17 120/72  10/24/16 130/84  04/25/16 124/76     EXAM:  BP 120/72 (BP Location: Right Arm, Patient Position: Sitting, Cuff Size: Normal)   Pulse 72   Ht 5\' 8"  (1.727 m)   Wt 201 lb 3.2 oz (91.3 kg)   SpO2 97%   BMI 30.59 kg/m       ASSESSMENT:  See history of present illness for detailed analysis of blood sugars, continuous glucose monitoring and discussion of day-to-day management and problems identified  His A1c is consistently good and is now 6.3  His blood sugars are averaging about 144 in the last few days complete recording not available for the last 2 weeks His A1c is lower than before but he also is having some tendency to almost daily recently This is despite his trying to adjust his basal rates on his own based on his blood sugar patterns As discussed above he is having frequent low sugars with threshold suspend late morning, occasionally early afternoon and also occasionally right after midnight and not always preceded by boluses  Hyperglycemia is also occasional but mostly late afternoon and occasionally during the night while working  He has numerous basal rates and difficult to adjust these since he keeps changing them on his own  Day-to-day  management of his insulin regimen and pump management reviewed   Again use of the new 670 pump and its advantages was  discussed today as he  is due for the new pump  Recommendations made today: Avoid changing basal rates on a daily basis He will look into the 670 pump He will call to get the training when this is available  Discussed bolusing for higher fat meals will be easier with the new pump Also he will be able to reduce hypoglycemia with a new pump because of the predictive technology Since his basal rates are indicating excess amounts midmorning he will reduce the 10 AM basal rate down to 1.4 Also will increase the 3:30 PM basal rate up to 2.15   Follow-up in 6 months again   HYPERCHOLESTEROLEMIA: Will recheck labs on the next visit  Screening TSH done as recommended by ADA  Counseling time on subjects discussed in assessment and plan sections is over 50% of today's 25 minute visit   Reather Littler 04/24/2017, 9:29 AM

## 2017-04-24 ENCOUNTER — Encounter: Payer: Self-pay | Admitting: Endocrinology

## 2017-04-24 ENCOUNTER — Ambulatory Visit: Payer: BLUE CROSS/BLUE SHIELD | Admitting: Endocrinology

## 2017-04-24 VITALS — BP 120/72 | HR 72 | Ht 68.0 in | Wt 201.2 lb

## 2017-04-24 DIAGNOSIS — E1065 Type 1 diabetes mellitus with hyperglycemia: Secondary | ICD-10-CM | POA: Diagnosis not present

## 2017-04-24 LAB — BASIC METABOLIC PANEL
BUN: 11 mg/dL (ref 6–23)
CO2: 29 mEq/L (ref 19–32)
Calcium: 9.4 mg/dL (ref 8.4–10.5)
Chloride: 102 mEq/L (ref 96–112)
Creatinine, Ser: 1.16 mg/dL (ref 0.40–1.50)
GFR: 73.97 mL/min (ref >60.00)
Glucose, Bld: 119 mg/dL — ABNORMAL HIGH (ref 70–99)
Potassium: 4.1 mEq/L (ref 3.5–5.1)
Sodium: 136 mEq/L (ref 135–145)

## 2017-04-24 LAB — MICROALBUMIN / CREATININE URINE RATIO
Creatinine,U: 70.9 mg/dL
Microalb Creat Ratio: 1 mg/g (ref 0.0–30.0)
Microalb, Ur: <0.7 mg/dL (ref 0.0–1.9)

## 2017-04-24 LAB — POCT GLYCOSYLATED HEMOGLOBIN (HGB A1C): Hemoglobin A1C: 6.3

## 2017-04-24 LAB — TSH: TSH: 1.74 u[IU]/mL (ref 0.35–4.50)

## 2017-05-10 ENCOUNTER — Telehealth: Payer: Self-pay

## 2017-05-10 NOTE — Telephone Encounter (Signed)
Received fax from [facility] stating that patient has been approved for the following: - External ambulatory infusion pump insulin  Approved for one unit beginning on 04/30/17 until 04/29/2018. Code: [id number] -Sensor; INVSV disp intrstl cont glu mon sys1U=1D  Approved for one unit from 04/30/17 until 04/29/2018. Code [id number] -Trasmitter; ext interstitial con. Glu mon. Sys.  Approved for 90 units from 04/30/17 until 05/01/18. Code: [id number]

## 2017-05-29 ENCOUNTER — Other Ambulatory Visit: Payer: Self-pay

## 2017-05-29 ENCOUNTER — Telehealth: Payer: Self-pay | Admitting: Endocrinology

## 2017-05-29 MED ORDER — INSULIN LISPRO 100 UNIT/ML ~~LOC~~ SOLN: SUBCUTANEOUS | 3 refills | Status: DC

## 2017-05-29 NOTE — Telephone Encounter (Signed)
Medication sent to pharmacy per patient request.  ?

## 2017-05-29 NOTE — Telephone Encounter (Signed)
Patient stated he needed to speak to someone about his insulin (HUMALOG)   insulin lispro (HUMALOG) 100 UNIT/ML injection  And he stated he received pump but did not have sensor.

## 2017-05-31 ENCOUNTER — Telehealth: Payer: Self-pay | Admitting: Endocrinology

## 2017-05-31 NOTE — Telephone Encounter (Signed)
[facility] in [place] needs patient latest labs faxed over to there office for the patient. They are having surgery next week    Fax- [phone] ATTN: Burna Mortimer

## 2017-05-31 NOTE — Telephone Encounter (Signed)
Labs faxed to office per request.

## 2017-06-11 ENCOUNTER — Telehealth: Payer: Self-pay | Admitting: Endocrinology

## 2017-06-11 NOTE — Telephone Encounter (Signed)
Please set him up for a 15-minute appointment before August 1

## 2017-06-11 NOTE — Telephone Encounter (Signed)
Patient has recently went on a pump and was told to call back to schedule appt with Dr to see how everything is going.  First Available for 30 min appt would be aug 1st. Would you like him to come in sooner or would this be okay?

## 2017-06-27 NOTE — Progress Notes (Signed)
Tommy Newman is a 42 y.o. male.              Reason for Appointment: Insulin Pump followup:   History of Present Illness   Diagnosis: Type 1 DIABETES MELITUS, date of diagnosis: 1991      DIABETES history: He has been on an insulin pump since 1997  CURRENT insulin pump:  [employer]tronic 670 G    A1c is generally below 7%; last 6.3, previously 6.5  His diabetes management his somewhat difficult because of his working 12 hour shift and mostly at night   The pump SETTINGS are: Changed frequent and has 10-14 different basal rates currently Highest basal rate is 2.0 between 3:30 PM up to 8:30 PM and lowest basal rate 1.0 at midnight and 4:30 AM Currently average daily basal is 32 units    Carb Ratio: 1: 15 Correction factor 1: 30 with target 90-125                                                                  GLUCOSE CONTROL with the pump is assessed today by pump and continuous glucose monitoring download  Current blood sugar patterns, problems identified and current management:  He has been in the auto mode for the last 3 weeks or so  With this he feels that he has had much less tendency to hypoglycemia  However he is still afraid of low blood sugars and will manually suspend the pump when his blood sugar is trending low normal  His blood sugars when he wakes up are usually excellent and with less variability  HIGHEST blood sugars on average are around midday probably after lunch with no consistent pattern  Also has HYPERGLYCEMIA after about 10 PM which may be partly postprandial  He has finally started entering carbohydrates at his mealtime BOLUSES which he was not doing prior to using the 670 pump  Blood sugars about 3 hours after his bolus are usually flat with current regimen of 4 hours active insulin time  For EXERCISE he appears to be suspending his sensor in his pump and at least sometimes appears to have high readings when he finishes exercise  He has exited  the AUTO mode mostly by manually disabling and only once because of max delivery or high blood sugar  His blood sugars are fluctuating less with standard deviation now 51 compared to 72 previously  HYPOGLYCEMIA occurred periodically in the late afternoon, around 8 PM and once around 2 AM  1 of his episodes of hypoglycemia was when he apparently over bolused for a relatively high fat meal  He is still trying to usually eat low-fat meals and avoiding pizza but he is concerned about how to bolus for such meals    Wt Readings from Last 3 Encounters:  06/28/17 182 lb 3.2 oz (82.6 kg)  04/24/17 201 lb 3.2 oz (91.3 kg)  10/24/16 199 lb (90.3 kg)    CGM use % of time  93  Average and SD 159 +/-51  Time in range       69 %  % Time Above 180  23  % Time above 250  6  % Time Below target  2    Mean values apply above for all meters except  median for One Touch  PRE-MEAL  6-8 AM Lunch Dinner Bedtime Overall  Glucose range:  69-240      Mean/median:  133  180  158     POST-MEAL PC Breakfast PC Lunch PC Dinner  Glucose range:     Mean/median:  179  165  163      Lab Results  Component Value Date   HGBA1C 6.3 04/24/2017   HGBA1C 6.5 10/24/2016   HGBA1C 6.9 04/25/2016   Lab Results  Component Value Date   MICROALBUR <0.7 04/24/2017   LDLCALC 59 10/24/2016   CREATININE 1.16 04/24/2017       Allergies as of 06/28/2017   No Known Allergies     Medication List        Accurate as of 06/28/17  8:51 PM. Always use your most recent med list.          atorvastatin 10 MG tablet Commonly known as:  LIPITOR TAKE 1 TABLET (10 MG TOTAL) BY MOUTH DAILY.   glucose blood test strip Commonly known as:  BAYER CONTOUR NEXT TEST Use as instructed to check blood sugar 7-8 times per day dx code E10.65   insulin lispro 100 UNIT/ML injection Commonly known as:  HUMALOG Using for insulin pump- 50 units daily   lamoTRIgine 100 MG tablet Commonly known as:  LAMICTAL Take 500 mg by  mouth daily.       Allergies: No Known Allergies  Past Medical History:  Diagnosis Date  . Seizures (HCC) since 2008    History reviewed. No pertinent surgical history.  Family History  Problem Relation Age of Onset  . Diabetes Neg Hx   . Heart disease Neg Hx     Social History:  reports that he has never smoked. He has never used smokeless tobacco. His alcohol and drug histories are not on file.  REVIEW of systems:   Has been on Lipitor for hypercholesterolemia, needs follow-up   Lab Results  Component Value Date   CHOL 133 10/24/2016   HDL 66.00 10/24/2016   LDLCALC 59 10/24/2016   TRIG 40.0 10/24/2016   CHOLHDL 2 10/24/2016        No retinopathy on his last exam in 2018   EXAM:  BP 122/70 (BP Location: Left Arm, Patient Position: Sitting, Cuff Size: Normal)   Pulse 76   Ht 5\' 8"  (1.727 m)   Wt 182 lb 3.2 oz (82.6 kg)   SpO2 96%   BMI 27.70 kg/m       ASSESSMENT:  See history of present illness for detailed analysis of blood sugars, continuous glucose monitoring and discussion of day-to-day management and problems identified  His A1c is consistently good  He is now using the 670 pump in the auto mode  With this his average blood sugars are better as also he has less variability and tendency to hypoglycemia  He still has questions about managing the pump in the auto mode and day-to-day management was discussed in detail He does have some issues with not being able to cover certain meals adequately possibly by not estimating carbohydrates correctly or occasionally when eating a relatively high fat meal he is bolusing extra amounts which may cause early drop in blood sugar Also for exercise he suspending his pump as before which may sometimes lead to high blood sugars when he comes out of exercise Also discussed what active insulin time indicates  Recommendations: For now recommended that he try to enter carbohydrates with every meal and  snack and  also try to be more accurate with looking up carbohydrates For relatively higher fat meals he can not try to enter a lot of extra carbohydrates but has the option of supplementing a meal bolus if blood sugar goes up within the next hour Showed him how to use a temporary target of 150 for exercise and not disable the pump and sensor when exercising Blood sugar patterns on his download were reviewed  Follow-up in November as scheduled but if he has difficulties with control he can be seen sooner or he can discuss management with the nurse educator   HYPERCHOLESTEROLEMIA: Will recheck labs on the next visit  Screening TSH done as recommended by ADA  Counseling time on subjects discussed in assessment and plan sections is over 50% of today's 25 minute visit   Reather LittlerAjay Robyne Matar 06/28/2017, 8:51 PM

## 2017-06-28 ENCOUNTER — Ambulatory Visit: Payer: BLUE CROSS/BLUE SHIELD | Admitting: Endocrinology

## 2017-06-28 ENCOUNTER — Encounter: Payer: Self-pay | Admitting: Endocrinology

## 2017-06-28 VITALS — BP 122/70 | HR 76 | Ht 68.0 in | Wt 182.2 lb

## 2017-06-28 DIAGNOSIS — E1065 Type 1 diabetes mellitus with hyperglycemia: Secondary | ICD-10-CM | POA: Diagnosis not present

## 2017-06-28 DIAGNOSIS — E7800 Pure hypercholesterolemia, unspecified: Secondary | ICD-10-CM | POA: Diagnosis not present

## 2017-06-28 NOTE — Patient Instructions (Signed)
Temp target for exercise

## 2017-07-19 ENCOUNTER — Encounter: Payer: Self-pay | Admitting: Endocrinology

## 2017-08-06 ENCOUNTER — Encounter: Payer: Self-pay | Admitting: Endocrinology

## 2017-08-06 ENCOUNTER — Other Ambulatory Visit: Payer: Self-pay

## 2017-08-06 MED ORDER — INSULIN LISPRO 100 UNIT/ML ~~LOC~~ SOLN: SUBCUTANEOUS | 3 refills | Status: DC

## 2017-08-23 ENCOUNTER — Other Ambulatory Visit: Payer: Self-pay | Admitting: Endocrinology

## 2017-09-10 ENCOUNTER — Encounter: Payer: Self-pay | Admitting: Endocrinology

## 2017-09-10 LAB — HM DIABETES EYE EXAM

## 2017-10-23 ENCOUNTER — Ambulatory Visit: Payer: BLUE CROSS/BLUE SHIELD | Admitting: Endocrinology

## 2017-10-30 ENCOUNTER — Other Ambulatory Visit: Payer: Self-pay

## 2017-10-30 MED ORDER — INSULIN LISPRO 100 UNIT/ML ~~LOC~~ SOLN: SUBCUTANEOUS | 3 refills | Status: DC

## 2017-11-01 ENCOUNTER — Ambulatory Visit: Payer: BLUE CROSS/BLUE SHIELD | Admitting: Endocrinology

## 2017-11-05 ENCOUNTER — Ambulatory Visit: Payer: BLUE CROSS/BLUE SHIELD | Admitting: Endocrinology

## 2017-12-12 NOTE — Progress Notes (Signed)
Tommy HearingKevin Newman is a 42 y.o. male.              Reason for Appointment: Insulin Pump followup:   History of Present Illness   Diagnosis: Type 1 DIABETES MELITUS, date of diagnosis: 1991      DIABETES history: He has been on an insulin pump since 1997  CURRENT insulin pump:  Medtronic 670 G    A1c is generally below 7%; last 6.3, now 7.3  The pump SETTINGS are: He has 10-14 different basal rates    Currently average daily basal is about 33 units Maximum basal rate 3.0    Carb Ratio: 1: 15 Correction factor 1: 30 with target 90-125 Active insulin time 3 hours                                                                GLUCOSE CONTROL with the pump is assessed today by pump and continuous glucose monitoring download  Current blood sugar patterns, problems identified and current management: He has been in the auto mode 97% of the time He has significantly variable basal rate activity seen on his download from week to week He is concerned about his blood sugars tending to be persistently high at times Also has most of his auto mode exits related to high blood sugar Blood sugar patterns discussed below   Wt Readings from Last 3 Encounters:  12/13/17 189 lb 9.6 oz (86 kg)  06/28/17 182 lb 3.2 oz (82.6 kg)  04/24/17 201 lb 3.2 oz (91.3 kg)    CONTINUOUS GLUCOSE MONITORING RECORD INTERPRETATION    Dates of Recording: Last 2 weeks  Sensor description: Guardian  Results statistics:   CGM use % of time  93  Average and SD  161+/-56  Time in range      65 %  % Time Above 180  24  % Time above 250  8  % Time Below target 3    Glycemic patterns summary: Blood sugars are on an average better during the night than in the day Overall highest blood sugars are after 8 PM with no consistent high or low patterns  Hyperglycemic episodes have been identified only after 10 PM once although he may sometimes have relatively persistent high readings in the afternoon and  evenings  Hypoglycemic episodes occurred rarely and twice during the night after a bolus and once during the day after a bolus.  On 11/38 blood sugar appeared to be getting low around 4 PM without a bolus  Overnight periods: Blood sugars are fairly steady with only one episode of hypoglycemia  Preprandial periods: Today he was appearing to have a dawn phenomena in the morning not otherwise not clearly evident Blood sugars are mildly increased before all the meals on 150+ at breakfast and 180 at lunch and dinner with more variability at dinnertime.  Also on an average blood sugar before meals at night is high  Postprandial periods: After breakfast: Blood sugars are fairly stable and flat with postprandial average 182 After lunch: Postprandial averages 28 mg higher 2 hours later and slightly lower at 3 hours After dinner: Blood sugars are relatively flat with significant variability   PRE-MEAL Fasting Lunch Dinner  overnight AC/PC Overall  Glucose range:  Mean/median:  156  179  181  2 1 4/158    POST-MEAL PC Breakfast PC Lunch PC Dinner  Glucose range:     Mean/median:  182  207  196       Lab Results  Component Value Date   HGBA1C 6.3 04/24/2017   HGBA1C 6.5 10/24/2016   HGBA1C 6.9 04/25/2016   Lab Results  Component Value Date   MICROALBUR <0.7 04/24/2017   LDLCALC 59 10/24/2016   CREATININE 1.16 04/24/2017       Allergies as of 12/13/2017   No Known Allergies     Medication List       Accurate as of December 13, 2017 10:08 AM. Always use your most recent med list.        atorvastatin 10 MG tablet Commonly known as:  LIPITOR TAKE 1 TABLET (10 MG TOTAL) BY MOUTH DAILY.   glucose blood test strip Commonly known as:  BAYER CONTOUR NEXT TEST Use as instructed to check blood sugar 7-8 times per day dx code E10.65   insulin lispro 100 UNIT/ML injection Commonly known as:  HUMALOG Using for insulin pump- 50 units daily   lamoTRIgine 100 MG  tablet Commonly known as:  LAMICTAL Take 500 mg by mouth daily.       Allergies: No Known Allergies  Past Medical History:  Diagnosis Date  . Seizures (HCC) since 2008    History reviewed. No pertinent surgical history.  Family History  Problem Relation Age of Onset  . Diabetes Neg Hx   . Heart disease Neg Hx     Social History:  reports that he has never smoked. He has never used smokeless tobacco. No history on file for alcohol and drug.  REVIEW of systems:   Has been on Lipitor for hypercholesterolemia, needs follow-up   Lab Results  Component Value Date   CHOL 133 10/24/2016   HDL 66.00 10/24/2016   LDLCALC 59 10/24/2016   TRIG 40.0 10/24/2016   CHOLHDL 2 10/24/2016       EXAM:  BP 130/78 (BP Location: Left Arm, Patient Position: Sitting, Cuff Size: Normal)   Pulse 73   Ht 5\' 8"  (1.727 m)   Wt 189 lb 9.6 oz (86 kg)   SpO2 99%   BMI 28.83 kg/m       ASSESSMENT:  See history of present illness for detailed analysis of blood sugars, continuous glucose monitoring and discussion of day-to-day management and problems identified  His A1c is higher at 7.3  He is using the 670 pump in the auto mode  Not clear why his A1c has gone up He has on an average about the same recent sensor glucose mean value of 161 with 65% of his readings within target He may occasionally have persistent hyperglycemia Occasionally this is diet related and rarely as a rebound from low sugar His postprandial readings are on an average fairly good Does not have any excessive rise or fall in his blood sugar at 3 to 4 hours after his bolus indicating appropriate active insulin time of 3 hours He also is occasionally doing split boluses for higher fat meals with some success  For now will not change his setting but he will be contacted by the diabetes educator from [employer] to see if his management can be changed in any way All his questions about management were answered  He can  try to avoid low sugars at work by using a temporary target of 150 Also in the  morning if blood sugars are rising after waking up he can do a correction bolus to cover the dawn phenomenon   HYPERCHOLESTEROLEMIA: Will recheck labs today  Counseling time on subjects discussed in assessment and plan sections is over 50% of today's 25 minute visit   Reather Littler 12/13/2017, 10:08 AM

## 2017-12-13 ENCOUNTER — Ambulatory Visit: Payer: BLUE CROSS/BLUE SHIELD | Admitting: Endocrinology

## 2017-12-13 ENCOUNTER — Encounter: Payer: Self-pay | Admitting: Endocrinology

## 2017-12-13 VITALS — BP 130/78 | HR 73 | Ht 68.0 in | Wt 189.6 lb

## 2017-12-13 DIAGNOSIS — E7800 Pure hypercholesterolemia, unspecified: Secondary | ICD-10-CM

## 2017-12-13 DIAGNOSIS — E1065 Type 1 diabetes mellitus with hyperglycemia: Secondary | ICD-10-CM | POA: Diagnosis not present

## 2017-12-13 LAB — BASIC METABOLIC PANEL
BUN: 10 mg/dL (ref 6–23)
CO2: 30 mEq/L (ref 19–32)
Calcium: 9.7 mg/dL (ref 8.4–10.5)
Chloride: 100 mEq/L (ref 96–112)
Creatinine, Ser: 1.12 mg/dL (ref 0.40–1.50)
GFR: 76.78 mL/min (ref >60.00)
Glucose, Bld: 137 mg/dL — ABNORMAL HIGH (ref 70–99)
Potassium: 4.2 mEq/L (ref 3.5–5.1)
Sodium: 136 mEq/L (ref 135–145)

## 2017-12-13 LAB — POCT GLYCOSYLATED HEMOGLOBIN (HGB A1C): Hemoglobin A1C: 7.3 % — AB (ref 4.0–5.6)

## 2017-12-13 LAB — LIPID PANEL
Cholesterol: 134 mg/dL (ref 0–200)
HDL: 65.5 mg/dL (ref >39.00)
LDL Cholesterol: 61 mg/dL (ref 0–99)
NonHDL: 68.68
Total CHOL/HDL Ratio: 2
Triglycerides: 39 mg/dL (ref 0.0–149.0)
VLDL: 7.8 mg/dL (ref 0.0–40.0)

## 2018-01-14 ENCOUNTER — Other Ambulatory Visit: Payer: Self-pay

## 2018-01-14 MED ORDER — INSULIN LISPRO 100 UNIT/ML ~~LOC~~ SOLN: SUBCUTANEOUS | 3 refills | Status: DC

## 2018-03-05 ENCOUNTER — Other Ambulatory Visit: Payer: Self-pay | Admitting: Endocrinology

## 2018-04-18 ENCOUNTER — Ambulatory Visit (INDEPENDENT_AMBULATORY_CARE_PROVIDER_SITE_OTHER): Payer: BLUE CROSS/BLUE SHIELD | Admitting: Endocrinology

## 2018-04-18 ENCOUNTER — Other Ambulatory Visit: Payer: Self-pay

## 2018-04-18 ENCOUNTER — Encounter: Payer: Self-pay | Admitting: Endocrinology

## 2018-04-18 DIAGNOSIS — E1065 Type 1 diabetes mellitus with hyperglycemia: Secondary | ICD-10-CM | POA: Diagnosis not present

## 2018-04-18 DIAGNOSIS — E785 Hyperlipidemia, unspecified: Secondary | ICD-10-CM

## 2018-04-18 NOTE — Progress Notes (Signed)
Tommy Newman is a 43 y.o. male.              Reason for Appointment: Insulin Pump followup:    Today's office visit was provided via telemedicine using video technique Explained to the patient and the the limitations of evaluation and management by telemedicine and the availability of in person appointments.  The patient understood the limitations and agreed to proceed. Patient also understood that the telehealth visit is billable. . Location of the patient: Home . Location of the provider: Office Only the patient and myself were participating in the encounter    History of Present Illness   Diagnosis: Type 1 DIABETES MELITUS, date of diagnosis: 1991      DIABETES history: He has been on an insulin pump since 1997  CURRENT insulin pump:  Medtronic 670 G    A1c is generally below 7%; last 7.3 Patient unable to come in for A1c today  Carb Ratio: 1: 15 Correction factor 1: 30 with target 90-125 Active insulin time 3 hours   Currently average daily basal is about 23 units Maximum basal rate 3.0                                                                GLUCOSE CONTROL with the pump is assessed today by pump  download  Current blood sugar patterns, problems identified and current management: He has been in the auto mode 97% of the time  Also has most of his auto mode exits are secondary to auto mode maximum delivery and 3 times from high blood sugar, also 3 times from auto mode being disabled  Because of current pandemic he has been laid off for 3 weeks, previously working night shifts frequently  CONTINUOUS GLUCOSE MONITORING RECORD INTERPRETATION    Dates of Recording: 04/03/13/20  Sensor description: Guardian  Results statistics with comparison to previous data:   CGM use % of time  94  Average and SD  162+/-50, was 161  Time in range       69 % was 65  % Time Above 180  27  % Time above 250  4  % Time Below target  0    PRE-MEAL  AC breakfast Lunch  Dinner Bedtime Overall  Glucose range:       Mean/median:  178  175  165     POST-MEAL PC Breakfast PC Lunch PC Dinner  Glucose range:     Mean/median:  127  171  167    Glycemic patterns summary: Glucose patterns show fairly steady overall average blood sugar with much more variability late at night and through the night until about 5-6 AM.  Lowest blood sugars are around 7-8 AM around the time he is waking up Hyperglycemic episodes have been sporadic and only indicated between 8:30 PM and midnight No hypoglycemia documented  Hyperglycemic episodes are occurring irregularly either late in the evenings, occasionally after 1 of his meals and sporadically during the night.  Most of his blood sugar average is higher between about 8:30 PM and 1 AM  Hypoglycemic episodes have not occurred However he thinks that sometimes his blood sugar may have a tendency to drop when he is walking although this is inconsistent  Overnight periods: Blood sugars are  quite variable and appear to be most variable during the night with no consistent pattern and occasionally may be very stable through the night  Preprandial periods: Blood sugars tend to be mildly increased at the time of breakfast bolus on an average but glucose ranges between 88-250 at the time of breakfast bolus Blood sugars around lunchtime are averaging 175 with some variability and also at dinnertime appears to have more variability with average blood sugar 165  Postprandial periods: After breakfast: He will have periodic spikes in his blood sugars but on an average blood sugars are coming down with correction from his pre-meal levels After lunch: Blood sugars are relatively flat on an average After dinner: Blood sugars may vary but do not seem to have any consistent rise or fall within 2 to 3 hours of the meal  POSTPRANDIAL readings may be occasionally higher with apparent delayed boluses after the start of the meal Patient does not  understand that bolus insulin is delivered right away and active insulin only indicates the duration of action and the body     Lab Results  Component Value Date   HGBA1C 7.3 (A) 12/13/2017   HGBA1C 6.3 04/24/2017   HGBA1C 6.5 10/24/2016   Lab Results  Component Value Date   MICROALBUR <0.7 04/24/2017   LDLCALC 61 12/13/2017   CREATININE 1.12 12/13/2017       Allergies as of 04/18/2018   No Known Allergies     Medication List       Accurate as of April 18, 2018  9:30 AM. Always use your most recent med list.        atorvastatin 10 MG tablet Commonly known as:  LIPITOR TAKE 1 TABLET (10 MG TOTAL) BY MOUTH DAILY.   glucose blood test strip Commonly known as:  [employer] Contour Next Test Use as instructed to check blood sugar 7-8 times per day dx code E10.65   insulin lispro 100 UNIT/ML injection Commonly known as:  HumaLOG Using for insulin pump- 50 units daily   lamoTRIgine 100 MG tablet Commonly known as:  LAMICTAL Take 500 mg by mouth daily.       Allergies: No Known Allergies  Past Medical History:  Diagnosis Date  . Seizures (HCC) since 2008    History reviewed. No pertinent surgical history.  Family History  Problem Relation Age of Onset  . Diabetes Neg Hx   . Heart disease Neg Hx     Social History:  reports that he has never smoked. He has never used smokeless tobacco. No history on file for alcohol and drug.  REVIEW of systems:    Has been on Lipitor for hypercholesterolemia, needs follow-up   Lab Results  Component Value Date   CHOL 134 12/13/2017   HDL 65.50 12/13/2017   LDLCALC 61 12/13/2017   TRIG 39.0 12/13/2017   CHOLHDL 2 12/13/2017   His weight has been recently about 185 and he thinks he is losing weight from cutting back on portions and walking regularly  Wt Readings from Last 3 Encounters:  12/13/17 189 lb 9.6 oz (86 kg)  06/28/17 182 lb 3.2 oz (82.6 kg)  04/24/17 201 lb 3.2 oz (91.3 kg)     EXAM:  There were no  vitals taken for this visit.      ASSESSMENT:  See history of present illness for detailed analysis of blood sugars, continuous glucose monitoring interpretation and discussion of day-to-day management and problems identified  He usually has an A1c around 7%  although last visit was 7.3  He is using the 670 pump in the auto mode  His blood sugars are within target range 69% of the time compared to 65% previously  Currently with his not working and especially with his not doing night shifts his blood sugar patterns are a little more stable However he appears to have periodically high readings late in the evenings after about 9 PM and marked variability and overnight readings for no apparent reason Occasional hyperglycemia that has happened otherwise is related to either bolusing postprandially, possibly not taking enough insulin to cover his meals and infusion site issues slightly Also discussed that he had suspended his pump a couple of days ago for 2 days.  And not clear why Reassured him that he does not have to suspend his pump when his blood sugars are in low normal range  He will try to bolus ahead of time before meals as much as possible To change his infusion set if he is having persistently high readings that are unexplained Background manual basal rate after 9 PM will be 1.65 until 2 AM He will come in to do his A1c when able to  Discussed day-to-day management of his boluses, action of mealtime insulin and current pre-and post prandial averages along with reasons for possible variability  Hypoglycemia has been minimized although he may have inconsistent drop in blood sugar when he is walking a lot for exercise He is aware of need for temporary basal setting for exercise    HYPERCHOLESTEROLEMIA: Will recheck labs on the next visit  Counseling time on subjects discussed in assessment and plan sections is over 50% of today's 25 minute visit   Reather Littler 04/18/2018, 9:30 AM

## 2018-05-06 ENCOUNTER — Telehealth: Payer: Self-pay | Admitting: Endocrinology

## 2018-05-06 NOTE — Telephone Encounter (Signed)
Responded to via my chart.

## 2018-05-06 NOTE — Telephone Encounter (Signed)
Please call him back regarding [employer] Sensors requested from [employer] on 04/29/2018 and 05/03/2018

## 2018-05-07 NOTE — Telephone Encounter (Signed)
Do not know of any paperwork for him

## 2018-07-26 ENCOUNTER — Telehealth: Payer: Self-pay

## 2018-07-26 NOTE — Telephone Encounter (Signed)
Check with Vaughan Basta, there may be appeal process from [employer] or [employer]

## 2018-07-26 NOTE — Telephone Encounter (Signed)
PA initiated via [email or web address] for Contour next test strips.  Tommy Newman Key: [id number] - PA Case ID: [id number] Drug Contour Next Test strips Form [employer] Electronic Prior Authorization Form (2017 NCPDP) Denied today Request Reference Number: [id number]. CONTOUR TES NEXT is denied due to Plan Exclusion. For further questions, call [phone]. Appeals are not supported through [employer]. Please refer to the fax case notice for appeals information and instructions.  PA was denied, despite citing that contour test strips are the only test strip that works in conjunction with pt's pump. Please advise.

## 2018-07-29 NOTE — Telephone Encounter (Signed)
Patient was called and given the 800 number for [employer] to have them get the author. For the test strips that go to his pump.

## 2018-08-01 ENCOUNTER — Telehealth: Payer: Self-pay | Admitting: Endocrinology

## 2018-08-01 NOTE — Telephone Encounter (Signed)
Called and they will be faxing forms again.

## 2018-08-01 NOTE — Telephone Encounter (Signed)
Tommy Newman with [employer] ph# [phone], ext [phone] called re: Tommy Newman received back the Certificate of Medical Neccessity but did NOT receive the [employer] form. Please call Tommy Newman at the ph# and ext# listed above to advise or send [employer] form to fax# [phone].

## 2018-08-08 NOTE — Telephone Encounter (Signed)
[employer] called with urgent fax number  [phone]

## 2018-08-22 ENCOUNTER — Ambulatory Visit: Payer: BC Managed Care – PPO | Admitting: Endocrinology

## 2018-08-22 ENCOUNTER — Encounter: Payer: Self-pay | Admitting: Endocrinology

## 2018-08-22 ENCOUNTER — Other Ambulatory Visit: Payer: Self-pay

## 2018-08-22 VITALS — BP 136/82 | HR 70 | Ht 68.0 in | Wt 197.6 lb

## 2018-08-22 DIAGNOSIS — E1065 Type 1 diabetes mellitus with hyperglycemia: Secondary | ICD-10-CM

## 2018-08-22 DIAGNOSIS — E785 Hyperlipidemia, unspecified: Secondary | ICD-10-CM | POA: Diagnosis not present

## 2018-08-22 LAB — LIPID PANEL
Cholesterol: 135 mg/dL (ref 0–200)
HDL: 56.4 mg/dL (ref >39.00)
LDL Cholesterol: 69 mg/dL (ref 0–99)
NonHDL: 78.68
Total CHOL/HDL Ratio: 2
Triglycerides: 50 mg/dL (ref 0.0–149.0)
VLDL: 10 mg/dL (ref 0.0–40.0)

## 2018-08-22 LAB — COMPREHENSIVE METABOLIC PANEL
ALT: 24 U/L (ref 0–53)
AST: 17 U/L (ref 0–37)
Albumin: 4.4 g/dL (ref 3.5–5.2)
Alkaline Phosphatase: 61 U/L (ref 39–117)
BUN: 15 mg/dL (ref 6–23)
CO2: 27 mEq/L (ref 19–32)
Calcium: 9.2 mg/dL (ref 8.4–10.5)
Chloride: 99 mEq/L (ref 96–112)
Creatinine, Ser: 1.33 mg/dL (ref 0.40–1.50)
GFR: 59.04 mL/min — ABNORMAL LOW (ref >60.00)
Glucose, Bld: 232 mg/dL — ABNORMAL HIGH (ref 70–99)
Potassium: 4.3 mEq/L (ref 3.5–5.1)
Sodium: 132 mEq/L — ABNORMAL LOW (ref 135–145)
Total Bilirubin: 1.6 mg/dL — ABNORMAL HIGH (ref 0.2–1.2)
Total Protein: 6.7 g/dL (ref 6.0–8.3)

## 2018-08-22 LAB — MICROALBUMIN / CREATININE URINE RATIO
Creatinine,U: 36 mg/dL
Microalb Creat Ratio: 1.9 mg/g (ref 0.0–30.0)
Microalb, Ur: <0.7 mg/dL (ref 0.0–1.9)

## 2018-08-22 LAB — POCT GLYCOSYLATED HEMOGLOBIN (HGB A1C): Hemoglobin A1C: 6.7 % — AB (ref 4.0–5.6)

## 2018-08-22 NOTE — Progress Notes (Signed)
Vickki HearingKevin Kell is a 43 y.o. male.              Reason for Appointment: Insulin Pump followup:    History of Present Illness   Diagnosis: Type 1 DIABETES MELITUS, date of diagnosis: 1991      DIABETES history: He has been on an insulin pump since 1997  CURRENT insulin pump:  Medtronic 670 G    A1c is generally below 7%; now back to 6.7 compared to 7.3   Carb Ratio: 1: 15 Correction factor 1: 30 with target 90-125 Active insulin time 3 hours   Currently average daily basal is about 23 units Maximum basal rate 3.0                                                                GLUCOSE CONTROL with the pump is assessed today by pump  download  Current blood sugar patterns, problems identified and current management: He has been in the auto mode 97% of the time  Also has most of his auto mode exits are secondary to auto mode maximum delivery and 3 times from high blood sugar, also 3 times from auto mode being disabled   CONTINUOUS GLUCOSE MONITORING RECORD INTERPRETATION    Dates of Recording: 8/7 through 8/20  Sensor description: Guardian  Results statistics:   CGM use % of time  62  Average and SD  156+/-50  Time in range        68 %  % Time Above 180  26  % Time above 250  4  % Time Below target  2    Glycemic patterns summary: Blood sugars are significant variability during the daytime and generally higher after about 4 PM until about 2 AM As discussed below has sporadic hypoglycemia also usually related to his boluses  Hyperglycemic episodes are occurring occasionally after meals or with correction doses during the morning hours and also periodically has a rebound after relatively low sugars  Hypoglycemic episodes occurred sporadically after all of his meals depending on his adjustment of his bolus requirement Also couple of times in the mornings his blood sugars have been low with taking correction dose Also may be related to having his target blood sugars  are high to low end to be 90 He does not think his blood sugar starts getting lower with exercise, he is going to the gym 3 times a week  Overnight periods: Blood sugars are mildly increased after midnight but improved by 3 AM and now fairly stable subsequently  Preprandial periods: When he is resting during the day his blood sugar may tend to rise after about 3-4 PM possibly from dawn phenomenon and also occasionally from rebounding related to low normal or low readings  Postprandial periods:   After breakfast:   Blood sugar may occasionally rise if he is underestimating his bolus but occasionally has on 8/13 he had a low sugar following his bolus.  Does not always eat a meal in the morning  After lunch:   Blood sugars are generally level with the preprandial readings with some variability  After dinner: Blood sugars are rising only occasionally above target but generally improving over the preprandial readings  As before occasionally may be related in doing his bolus at  the start of the meal   PRE-MEAL Fasting Lunch Dinner Hs AC/PC Overall  Glucose range:        Mean/median: 151 180  203  178/194    POST-MEAL PC Breakfast PC Lunch PC Dinner  Glucose range:     Mean/median:  179  172  168       Lab Results  Component Value Date   HGBA1C 6.7 (A) 08/22/2018   HGBA1C 7.3 (A) 12/13/2017   HGBA1C 6.3 04/24/2017   Lab Results  Component Value Date   MICROALBUR <0.7 04/24/2017   LDLCALC 61 12/13/2017   CREATININE 1.12 12/13/2017       Allergies as of 08/22/2018   No Known Allergies     Medication List       Accurate as of August 22, 2018 11:07 AM. If you have any questions, ask your nurse or doctor.        atorvastatin 10 MG tablet Commonly known as: LIPITOR TAKE 1 TABLET (10 MG TOTAL) BY MOUTH DAILY.   glucose blood test strip Commonly known as: [employer] Contour Next Test Use as instructed to check blood sugar 7-8 times per day dx code E10.65   insulin lispro  100 UNIT/ML injection Commonly known as: HumaLOG Using for insulin pump- 50 units daily   lamoTRIgine 100 MG tablet Commonly known as: LAMICTAL Take 500 mg by mouth daily.       Allergies: No Known Allergies  Past Medical History:  Diagnosis Date  . Seizures (HCC) since 2008    History reviewed. No pertinent surgical history.  Family History  Problem Relation Age of Onset  . Diabetes Neg Hx   . Heart disease Neg Hx     Social History:  reports that he has never smoked. He has never used smokeless tobacco. No history on file for alcohol and drug.  REVIEW of systems:    Has been on Lipitor for hypercholesterolemia, needs follow-up labs  Lab Results  Component Value Date   CHOL 134 12/13/2017   HDL 65.50 12/13/2017   LDLCALC 61 12/13/2017   TRIG 39.0 12/13/2017   CHOLHDL 2 12/13/2017   Appears to have gained weight recently and not clear why  Wt Readings from Last 3 Encounters:  08/22/18 197 lb 9.6 oz (89.6 kg)  12/13/17 189 lb 9.6 oz (86 kg)  06/28/17 182 lb 3.2 oz (82.6 kg)     EXAM:  BP 136/82 (BP Location: Left Arm, Patient Position: Sitting, Cuff Size: Normal)   Pulse 70   Ht 5\' 8"  (1.727 m)   Wt 197 lb 9.6 oz (89.6 kg)   SpO2 98%   BMI 30.04 kg/m       ASSESSMENT:  See history of present illness for detailed analysis of blood sugars, continuous glucose monitoring interpretation and discussion of day-to-day management and problems identified  He usually has an A1c around 7% It is down to 6.7 and improved  He is using the 670 pump in the auto mode  His blood sugars are within target range 68 % of the time compared to 69 % previously  He has not been able to use a sensor consistently over the last 2 weeks and tends to have more variability in the manual mode Hypoglycemia has occurred postprandially, occasionally from overcorrection  HYPERGLYCEMIA appears to be either related to dawn phenomenon, inadequate bolusing at meals sometimes and  occasionally delayed boluses as well as rebound from low sugars   Discussed his current blood sugar patterns, causes  for high and low sugars, improvement in estimation of his mealtime coverage as well as taking additional correction doses for rising blood sugars including on waking up in the afternoons  He will also change his correction factor to 1: 40 between midnight and 12 noon for now Consider raising his target from 90 up to 220   HYPERCHOLESTEROLEMIA: Will recheck labs today  Counseling time on subjects discussed in assessment and plan sections is over 50% of today's 25 minute visit   Elayne Snare 08/22/2018, 11:07 AM

## 2018-08-23 ENCOUNTER — Other Ambulatory Visit (INDEPENDENT_AMBULATORY_CARE_PROVIDER_SITE_OTHER): Payer: BC Managed Care – PPO

## 2018-08-23 ENCOUNTER — Other Ambulatory Visit: Payer: Self-pay | Admitting: Endocrinology

## 2018-08-23 ENCOUNTER — Other Ambulatory Visit: Payer: Self-pay

## 2018-08-23 DIAGNOSIS — E871 Hypo-osmolality and hyponatremia: Secondary | ICD-10-CM | POA: Diagnosis not present

## 2018-08-23 LAB — T4, FREE: Free T4: 1.02 ng/dL (ref 0.60–1.60)

## 2018-08-23 LAB — TSH: TSH: 0.74 u[IU]/mL (ref 0.35–4.50)

## 2018-09-25 ENCOUNTER — Other Ambulatory Visit: Payer: Self-pay | Admitting: Endocrinology

## 2018-11-26 ENCOUNTER — Other Ambulatory Visit: Payer: Self-pay | Admitting: Endocrinology

## 2018-12-05 ENCOUNTER — Other Ambulatory Visit: Payer: Self-pay

## 2018-12-05 MED ORDER — INSULIN LISPRO 100 UNIT/ML ~~LOC~~ SOLN: SUBCUTANEOUS | 3 refills | Status: DC

## 2018-12-13 ENCOUNTER — Other Ambulatory Visit: Payer: Self-pay

## 2018-12-16 NOTE — Progress Notes (Signed)
Tommy HearingKevin Newman is a 43 y.o. male.              Reason for Appointment: Insulin Pump followup:    History of Present Illness   Diagnosis: Type 1 DIABETES MELITUS, date of diagnosis: 1991      DIABETES history: He has been on an insulin pump since 1997  CURRENT insulin pump:  Medtronic 670 G    A1c is generally around 7% and now 7.1   Carb Ratio: 1: 15 Correction factor 1: 30 with target 90-125 Active insulin time 3 hours   Currently average daily basal is about 23 units Maximum basal rate 3.0                                                                GLUCOSE CONTROL with the pump is assessed today by pump  download  Current blood sugar patterns, problems identified and current management:  He has been in the auto mode 90 % of the time  Reason for auto mode exits are due to high sensor glucose 3 times, disabled by user 6 times, BG required 2 times and algorithm under read 2 times   HYPERGLYCEMIA appears to be either related to rebound from low normal sugars, possible infusion site issues, likely missed boluses at times and occasionally inadequate bolusing at meals   CONTINUOUS GLUCOSE MONITORING RECORD INTERPRETATION    Dates of Recording: 12/2 through 12/15  Sensor description: Guardian  Results statistics:   CGM use % of time  88  Average and SD  153+/-56  Time in range  73     %  % Time Above 180  20  % Time above 250  6  % Time Below target  #1    PRE-MEAL Fasting Lunch Dinner Bedtime Overall  Glucose range:       Mean/median:  132  184  191  167/148  153   POST-MEAL PC Breakfast PC Lunch PC Dinner  Glucose range:     Mean/median:  151  163  142    Glycemic patterns summary: Blood sugars are occasionally appearing to be not correlating with the fingerstick glucose He has significant variability in blood sugars at times except overnight Although hypoglycemia has been transient he has had 3 episodes in the last 2 weeks Hyperglycemia has been  occurring mostly late evening with overall highest blood sugars around 7 PM, 10 PM or midnight  Hyperglycemic episodes are occurring sporadically at different times and occasionally postprandial  Hypoglycemic episodes occurred 3 times, once around 7:30 AM, another time around 12 noon and once at about 6 PM Episode at 7:30 AM may have been related to increased activity  Overnight periods: Blood sugars are relatively well controlled between 3 AM and 8 AM 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 but these are not the same day of the week   Preprandial periods: Blood sugars are usually fairly good at breakfast times, significantly variable at other meals which are also inconsistent times  Postprandial periods:   After breakfast: Has 6 boluses recorded and blood sugars are excellent postprandially with only mild increase except on 2 occasions     After lunch:   Blood sugars may be higher before the meal  but after the meal blood sugars are on an average well-controlled  After dinner: Blood sugars appear to be showing a tendency to be lower than preprandial readings including some hypoglycemia at 1-3 hours postprandially   Previous data:  CGM use % of time  62  Average and SD  156+/-50  Time in range        68 %  % Time Above 180  26  % Time above 250  4  % Time Below target  2      Lab Results  Component Value Date   HGBA1C 7.1 (A) 12/17/2018   HGBA1C 6.7 (A) 08/22/2018   HGBA1C 7.3 (A) 12/13/2017   Lab Results  Component Value Date   MICROALBUR <0.7 08/22/2018   LDLCALC 69 08/22/2018   CREATININE 1.33 08/22/2018       Allergies as of 12/17/2018   No Known Allergies     Medication List       Accurate as of December 17, 2018 11:05 AM. If you have any questions, ask your nurse or doctor.        atorvastatin 10 MG tablet Commonly known as: LIPITOR TAKE 1 TABLET BY MOUTH EVERY DAY   glucose blood test strip Commonly known as: [employer] Next Test Use as instructed to check blood sugar 7-8 times per day dx code E10.65   insulin lispro 100 UNIT/ML injection Commonly known as: HumaLOG INJECT SUBCUTANEOUSLY 50  UNITS VIA INSULIN PUMP  DAILY   lamoTRIgine 100 MG tablet Commonly known as: LAMICTAL Take 500 mg by mouth daily.       Allergies: No Known Allergies  Past Medical History:  Diagnosis Date  . Seizures ([place]) since 2008    History reviewed. No pertinent surgical history.  Family History  Problem Relation Age of Onset  . Diabetes Neg Hx   . Heart disease Neg Hx     Social History:  reports that he has never smoked. He has never used smokeless tobacco. No history on file for alcohol and drug.  REVIEW of systems:    Has been on Lipitor for hypercholesterolemia, and he thinks he has been taking it regularly with fairly good results this year  Lab Results  Component Value Date   CHOL 135 08/22/2018   HDL 56.40 08/22/2018   LDLCALC 69 08/22/2018   TRIG 50.0 08/22/2018   CHOLHDL 2 08/22/2018   Weight has leveled off, thyroid levels have been normal  Wt Readings from Last 3 Encounters:  12/17/18 194 lb 12.8 oz (88.4 kg)  08/22/18 197 lb 9.6 oz (89.6 kg)  12/13/17 189 lb 9.6 oz (86 kg)   Occasionally has low sodium of unclear etiology  Lab Results  Component Value Date   CREATININE 1.33 08/22/2018   BUN 15 08/22/2018   NA 132 (L) 08/22/2018   K 4.3 08/22/2018   CL 99 08/22/2018   CO2 27 08/22/2018      EXAM:  BP 116/60 (BP Location: Left Arm, Patient Position: Sitting, Cuff Size: Normal)   Pulse 74   Ht 5\' 8"  (1.727 m)   Wt 194 lb 12.8 oz (88.4 kg)   SpO2 97%   BMI 29.62 kg/m       ASSESSMENT:  See history of present illness for detailed analysis of blood sugars, continuous glucose monitoring interpretation and discussion of day-to-day management and problems identified  He usually has an A1c around 7% and now 7.1  He is using the 670 pump in the auto  mode  His  blood sugars are within target range 73 % of the time compared to 68 % previously  His sensor appears to be periodically inaccurate However he still appears to have significant variability in his blood sugars with unexpected high and low sugars Some of his low or low normal sugars appear to be after evening boluses and this may occasionally result in rebound hyperglycemia  Hypoglycemia has occurred sporadically and not clear if some of these are related to increased activity at work Does not think his blood sugars are low with exercise Is aware of the temporary target mood if he needs to use this   HYPERGLYCEMIA appears to be either related to rebound from low normal sugars, possible infusion site issues, likely missed boluses at times and occasionally inadequate bolusing at meals  Recommendations:  Carbohydrate coverage 1: 18 at dinnertime  As before he needs to add extra for higher fat meals  He will need to look into the new 770 pump to upgrade his sensor which may provide better accuracy since he has discordance between the sensor and fingerstick readings at times  No change in sensitivity at this time  Use temporary target when he is more active at work  Trial of Lyumjev to see if it will reduce variability in blood sugars from shorter duration of action    HYPERCHOLESTEROLEMIA: Continue long-term Lipitor  History of hyponatremia: We will recheck labs today  Counseling time on subjects discussed in assessment and plan sections is over 50% of today's 25 minute visit   Reather Littler 12/17/2018, 11:05 AM

## 2018-12-17 ENCOUNTER — Encounter: Payer: Self-pay | Admitting: Endocrinology

## 2018-12-17 ENCOUNTER — Ambulatory Visit: Payer: BC Managed Care – PPO | Admitting: Endocrinology

## 2018-12-17 VITALS — BP 116/60 | HR 74 | Ht 68.0 in | Wt 194.8 lb

## 2018-12-17 DIAGNOSIS — E871 Hypo-osmolality and hyponatremia: Secondary | ICD-10-CM

## 2018-12-17 DIAGNOSIS — E1065 Type 1 diabetes mellitus with hyperglycemia: Secondary | ICD-10-CM | POA: Diagnosis not present

## 2018-12-17 LAB — BASIC METABOLIC PANEL
BUN: 16 mg/dL (ref 6–23)
CO2: 28 mEq/L (ref 19–32)
Calcium: 9 mg/dL (ref 8.4–10.5)
Chloride: 100 mEq/L (ref 96–112)
Creatinine, Ser: 1.28 mg/dL (ref 0.40–1.50)
GFR: 61.62 mL/min (ref >60.00)
Glucose, Bld: 248 mg/dL — ABNORMAL HIGH (ref 70–99)
Potassium: 4.2 mEq/L (ref 3.5–5.1)
Sodium: 132 mEq/L — ABNORMAL LOW (ref 135–145)

## 2018-12-17 LAB — POCT GLYCOSYLATED HEMOGLOBIN (HGB A1C): Hemoglobin A1C: 7.1 % — AB (ref 4.0–5.6)

## 2018-12-17 MED ORDER — LYUMJEV 100 UNIT/ML IJ SOLN: 50.0000 [IU] | Freq: Every day | INTRAMUSCULAR | 1 refills | Status: DC

## 2019-01-09 ENCOUNTER — Other Ambulatory Visit: Payer: Self-pay

## 2019-01-09 MED ORDER — LYUMJEV 100 UNIT/ML IJ SOLN: 50.0000 [IU] | Freq: Every day | INTRAMUSCULAR | 1 refills | Status: DC

## 2019-04-12 ENCOUNTER — Other Ambulatory Visit: Payer: Self-pay | Admitting: Endocrinology

## 2019-04-16 NOTE — Progress Notes (Signed)
Tommy Newman is a 44 y.o. male.              Reason for Appointment: Insulin Pump followup:    History of Present Illness   Diagnosis: Type 1 DIABETES MELITUS, date of diagnosis: 1991      DIABETES history: He has been on an insulin pump since 1997  CURRENT insulin pump:  Medtronic 670 G    A1c is generally around 7% and again 7.1   Carb Ratio: 1: 15 except 1: 18 at suppertime, correction factor 1: 30 with target 90-125 Active insulin time 3 hours   Currently average daily basal is about 23 units Maximum basal rate 3.0                                                                GLUCOSE CONTROL with the pump is assessed today by pump  download  Current blood sugar patterns, problems identified and current management:  He has been in the auto mode 90 % of the time  Reason for auto mode exits are due to  disabled by user 4 times,  and algorithm under read 5 times   HYPERGLYCEMIA occurs recurrently with late or inadequate boluses, spontaneous fluctuation and sometimes excessive rebound from low normal sugars and do not occur at any certain time He is usually not eating meals at work HYPOGLYCEMIA has been minimal although he thinks his blood sugars tend to drop while he is sleeping, this cannot be confirmed on his CGM most of the time He is variably active at work but this does not cause low sugars   CONTINUOUS GLUCOSE MONITORING RECORD INTERPRETATION    Dates of Recording: 4/2 through 4/15  Sensor description: Guardian  Results statistics:   CGM use % of time   Average and SD   Time in range        %  % Time Above 180   % Time above 250   % Time Below target     PRE-MEAL Fasting Lunch Dinner Bedtime Overall  Glucose range:       Mean/median:  143  166  184     POST-MEAL PC Breakfast PC Lunch PC Dinner  Glucose range:     Mean/median:  119  180  148    Glycemic patterns summary: On an average blood sugars are about the same throughout the day at  night but relatively higher between 4-6 PM and lowest around 8-9 AM  Hyperglycemic episodes are occurring randomly at various times but only occasionally related to his postprandial spikes Has 14 hyperglycemic episodes around 3-4 AM, 4-5 AM and 9-10 AM  Hypoglycemic episodes occurred 0.4/day but only transiently and has low normal sugars at all different times particularly around 7 AM  Overnight periods: Since 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 significantly low which days he is sleeping  Preprandial periods: Blood sugars are generally fairly well controlled but on an average are higher at dinnertime with average 184   Postprandial periods:   After breakfast:   Blood sugars are well controlled although has only 4 bolus episodes After lunch:   Usually blood sugars are on an average not rising excessively After dinner: Blood sugars after meals are variable with sometimes low normal readings and occasionally slightly  higher readings especially in the first hour Blood sugars may be rising gradually at 3 hours, currently active insulin time is 3 hours     CGM use % of time  88  Average and SD  153+/-56  Time in range  73     %  % Time Above 180  20  % Time above 250  6  % Time Below target  #1    PRE-MEAL Fasting Lunch Dinner Bedtime Overall  Glucose range:       Mean/median:  132  184  191  167/148  153   POST-MEAL PC Breakfast PC Lunch PC Dinner  Glucose range:     Mean/median:  151  163  142    Glycemic patterns summary: Blood sugars are occasionally appearing to be not correlating with the fingerstick glucose He has significant variability in blood sugars at times except overnight Although hypoglycemia has been transient he has had 3 episodes in the last 2 weeks Hyperglycemia has been occurring mostly late evening with overall highest blood sugars around 7 PM, 10 PM or midnight  Hyperglycemic episodes are occurring sporadically at different times and occasionally  postprandial  Hypoglycemic episodes occurred 3 times, once around 7:30 AM, another time around 12 noon and once at about 6 PM Episode at 7:30 AM may have been related to increased activity  Overnight periods: Blood sugars are relatively well controlled between 3 AM and 8 AM 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 but these are not the same day of the week   Preprandial periods: Blood sugars are usually fairly good at breakfast times, significantly variable at other meals which are also inconsistent times  Postprandial periods:   After breakfast: Has 6 boluses recorded and blood sugars are excellent postprandially with only mild increase except on 2 occasions     After lunch:   Blood sugars may be higher before the meal but after the meal blood sugars are on an average well-controlled  After dinner: Blood sugars appear to be showing a tendency to be lower than preprandial readings including some hypoglycemia at 1-3 hours postprandially   Previous data:  CGM use % of time  62  Average and SD  156+/-50  Time in range        68 %  % Time Above 180  26  % Time above 250  4  % Time Below target  2      Lab Results  Component Value Date   HGBA1C 7.1 (A) 04/17/2019   HGBA1C 7.1 (A) 12/17/2018   HGBA1C 6.7 (A) 08/22/2018   Lab Results  Component Value Date   MICROALBUR <0.7 08/22/2018   LDLCALC 69 08/22/2018   CREATININE 1.28 12/17/2018       Allergies as of 04/17/2019   No Known Allergies     Medication List       Accurate as of April 17, 2019 10:25 AM. If you have any questions, ask your nurse or doctor.        atorvastatin 10 MG tablet Commonly known as: LIPITOR TAKE 1 TABLET BY MOUTH EVERY DAY   glucose blood test strip Commonly known as: Banker Next Test Use as instructed to check blood sugar 7-8 times per day dx code E10.65   insulin lispro 100 UNIT/ML injection Commonly known as: HumaLOG INJECT SUBCUTANEOUSLY 50  UNITS  VIA INSULIN PUMP  DAILY   lamoTRIgine 100 MG tablet Commonly known as: LAMICTAL  Take 500 mg by mouth daily.   Lyumjev 100 UNIT/ML Soln Generic drug: Insulin Lispro-aabc Inject 50 Units as directed daily. Use in insulin pump       Allergies: No Known Allergies  Past Medical History:  Diagnosis Date  . Seizures (HCC) since 2008    No past surgical history on file.  Family History  Problem Relation Age of Onset  . Diabetes Neg Hx   . Heart disease Neg Hx     Social History:  reports that he has never smoked. He has never used smokeless tobacco. No history on file for alcohol and drug.  REVIEW of systems:    Has been on Lipitor for hypercholesterolemia, taking this regularly  Lab Results  Component Value Date   CHOL 135 08/22/2018   HDL 56.40 08/22/2018   LDLCALC 69 08/22/2018   TRIG 50.0 08/22/2018   CHOLHDL 2 08/22/2018   Weight has been stable  Wt Readings from Last 3 Encounters:  04/17/19 197 lb 6.4 oz (89.5 kg)  12/17/18 194 lb 12.8 oz (88.4 kg)  08/22/18 197 lb 9.6 oz (89.6 kg)   Occasionally has low sodium of unclear etiology, not on any medications that would cause this   Lab Results  Component Value Date   CREATININE 1.28 12/17/2018   BUN 16 12/17/2018   NA 132 (L) 12/17/2018   K 4.2 12/17/2018   CL 100 12/17/2018   CO2 28 12/17/2018      EXAM:  BP 120/76 (BP Location: Left Arm, Patient Position: Sitting, Cuff Size: Large)   Pulse 61   Ht 5\' 8"  (1.727 m)   Wt 197 lb 6.4 oz (89.5 kg)   SpO2 99%   BMI 30.01 kg/m       ASSESSMENT:  See history of present illness for detailed analysis of blood sugars, continuous glucose monitoring interpretation and discussion of day-to-day management and problems identified  He usually has an A1c around 7% and now 7.1  He is using the 670 pump in the auto mode  His blood sugars are within target range again 73 % of the time Not having excessive hypoglycemia with 2% of readings below  normal However he thinks that his blood sugars tend to drop during his sleep and cannot confirm this on his sensor readings  His day-to-day patterns were reviewed in detail on the sensor tracing  He is not sure if he had better results with Lyumjev that was given to him previously   HYPERGLYCEMIA is either postprandial or related to normal fluctuations in his blood sugars especially if blood sugars are getting low normal and then rebounding either from activity level or spontaneous changes As before he has higher readings in the manual mode which has not been used much  Recommendations:  Although he may need a lower carbohydrate ratio at dinnertime he does not have any consistent pattern  May do better with Lyumjev with postprandial readings especially since he can time his insulin better and not after weight 10 to 15 minutes  He has some of this at home and he will try and let June know if this improves his postprandial readings, helps him correct high readings better and also avoid less rebound hyperglycemia with low normal readings  Also may consider changing sensitivity level if he tends to be low normal more frequently after correction doses  Use temporary target when he is more active at work  May also need to consider shortening active insulin time from 3hours to 2  hours and 45 minutes with Lyumjev   HYPERCHOLESTEROLEMIA: Recheck lipids today continue long-term Lipitor  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

## 2019-04-17 ENCOUNTER — Ambulatory Visit: Payer: BC Managed Care – PPO | Admitting: Endocrinology

## 2019-04-17 ENCOUNTER — Other Ambulatory Visit: Payer: Self-pay

## 2019-04-17 ENCOUNTER — Encounter: Payer: Self-pay | Admitting: Endocrinology

## 2019-04-17 VITALS — BP 120/76 | HR 61 | Ht 68.0 in | Wt 197.4 lb

## 2019-04-17 DIAGNOSIS — E871 Hypo-osmolality and hyponatremia: Secondary | ICD-10-CM | POA: Diagnosis not present

## 2019-04-17 DIAGNOSIS — E1065 Type 1 diabetes mellitus with hyperglycemia: Secondary | ICD-10-CM

## 2019-04-17 DIAGNOSIS — E785 Hyperlipidemia, unspecified: Secondary | ICD-10-CM

## 2019-04-17 LAB — POCT GLYCOSYLATED HEMOGLOBIN (HGB A1C): Hemoglobin A1C: 7.1 % — AB (ref 4.0–5.6)

## 2019-04-17 LAB — COMPREHENSIVE METABOLIC PANEL
ALT: 30 U/L (ref 0–53)
AST: 22 U/L (ref 0–37)
Albumin: 4.3 g/dL (ref 3.5–5.2)
Alkaline Phosphatase: 67 U/L (ref 39–117)
BUN: 20 mg/dL (ref 6–23)
CO2: 29 mEq/L (ref 19–32)
Calcium: 9.4 mg/dL (ref 8.4–10.5)
Chloride: 100 mEq/L (ref 96–112)
Creatinine, Ser: 1.31 mg/dL (ref 0.40–1.50)
GFR: 59.9 mL/min — ABNORMAL LOW (ref >60.00)
Glucose, Bld: 199 mg/dL — ABNORMAL HIGH (ref 70–99)
Potassium: 4.3 mEq/L (ref 3.5–5.1)
Sodium: 133 mEq/L — ABNORMAL LOW (ref 135–145)
Total Bilirubin: 1.7 mg/dL — ABNORMAL HIGH (ref 0.2–1.2)
Total Protein: 6.7 g/dL (ref 6.0–8.3)

## 2019-04-17 LAB — URINALYSIS, ROUTINE W REFLEX MICROSCOPIC
Bilirubin Urine: NEGATIVE
Hgb urine dipstick: NEGATIVE
Ketones, ur: NEGATIVE
Leukocytes,Ua: NEGATIVE
Nitrite: NEGATIVE
RBC / HPF: NONE SEEN (ref 0–?)
Specific Gravity, Urine: <=1.005 — AB (ref 1.000–1.030)
Total Protein, Urine: NEGATIVE
Urine Glucose: NEGATIVE
Urobilinogen, UA: 0.2 (ref 0.0–1.0)
WBC, UA: NONE SEEN (ref 0–?)
pH: 6 (ref 5.0–8.0)

## 2019-04-17 LAB — MICROALBUMIN / CREATININE URINE RATIO
Creatinine,U: 30.9 mg/dL
Microalb Creat Ratio: 2.3 mg/g (ref 0.0–30.0)
Microalb, Ur: <0.7 mg/dL (ref 0.0–1.9)

## 2019-04-17 LAB — LIPID PANEL
Cholesterol: 144 mg/dL (ref 0–200)
HDL: 59.7 mg/dL (ref >39.00)
LDL Cholesterol: 76 mg/dL (ref 0–99)
NonHDL: 84.42
Total CHOL/HDL Ratio: 2
Triglycerides: 40 mg/dL (ref 0.0–149.0)
VLDL: 8 mg/dL (ref 0.0–40.0)

## 2019-04-17 NOTE — Patient Instructions (Signed)
Try Lyumjev

## 2019-04-18 LAB — SODIUM, URINE, RANDOM: Sodium, Ur: 25 mmol/L

## 2019-04-18 LAB — OSMOLALITY, URINE: Osmolality, Ur: 170 mOsmol/kg

## 2019-07-03 ENCOUNTER — Telehealth: Payer: Self-pay

## 2019-07-03 NOTE — Telephone Encounter (Signed)
FAXED DOCUMENTS  Company: [employer] (Faxed on 07/03/19) Document: RX for pump supplies. Other records requested: none at this time.  All above requested information has been faxed successfully to [employer] listed above. Documents and fax confirmation have been placed in the faxed file for future reference.

## 2019-10-05 ENCOUNTER — Other Ambulatory Visit: Payer: Self-pay | Admitting: Endocrinology

## 2019-10-16 ENCOUNTER — Ambulatory Visit (INDEPENDENT_AMBULATORY_CARE_PROVIDER_SITE_OTHER): Payer: BC Managed Care – PPO | Admitting: Endocrinology

## 2019-10-16 ENCOUNTER — Other Ambulatory Visit: Payer: Self-pay

## 2019-10-16 VITALS — BP 148/101 | HR 72 | Ht 69.25 in | Wt 190.8 lb

## 2019-10-16 DIAGNOSIS — E1065 Type 1 diabetes mellitus with hyperglycemia: Secondary | ICD-10-CM

## 2019-10-16 LAB — POCT GLYCOSYLATED HEMOGLOBIN (HGB A1C): Hemoglobin A1C: 7.4 % — AB (ref 4.0–5.6)

## 2019-10-16 MED ORDER — BAQSIMI TWO PACK 3 MG/DOSE NA POWD: NASAL | 1 refills | Status: DC

## 2019-10-16 NOTE — Progress Notes (Addendum)
Tommy Newman is a 44 y.o. male.              Reason for Appointment: Insulin Pump followup:    History of Present Illness   Diagnosis: Type 1 DIABETES MELITUS, date of diagnosis: 1991      DIABETES history: He has been on an insulin pump since 1997  CURRENT insulin pump:  Medtronic 670    Carb Ratio: 1: 15 except 1: 18 at suppertime, correction factor 1: 30 with target 90-125 Active insulin time 3 hours                                       A1c is 7.4 compared to 7.1                            GLUCOSE CONTROL with the pump is assessed today by pump  download  He is working night shifts  Current blood sugar patterns, problems identified and current management:  He has been in the auto mode 90 % of the time  Reason for auto mode exits are due to  disabled by user 4 times, high blood sugar more than 14 times and max delivery 2 times  Blood sugars are reviewed from his continuous glucose monitoring records on the pump download as well as data on the log book  HYPERGLYCEMIA occurs most frequently around 7-8 AM or 2-3 PM, occasionally 6-7 PM Most of his hyperglycemia is related to recently being on prednisone POSTPRANDIAL hyperglycemia has been occurring mostly after evening meal However blood sugars are averaging high even before the evening meal  He is usually not eating meals at work HYPOGLYCEMIA has been occurring occasionally, one episode around 4-5 AM Also 2 days ago had severe hypoglycemia around 11 PM at work preceded by several correction doses for persistently high sugars He does not have glucagon available, but does not have an adult living with him and his coworkers are not able to give him the injection  Pump management:  He was given a trial of Lyumjev after his last visit but not clear if he had better sugars with this and he did not call for prescription  Trying to bolus 10 to 15 minutes before eating usually but at least at dinnertime if blood sugars  appear to be higher even before his bolus is delivered causing the Premeal blood sugar to be over 200  He is suspending his pump periodically, once up to 2 hours because he is afraid of low blood sugars, this may cause rebound hyperglycemia  He thinks that occasionally his sensor may not be completely accurate especially towards the end of the life of the sensor  Try to calibrate about every 12 hours, at least twice a day  He is variably active at work      CGM use % of time  90  Average and SD  182+/-73  Time in range     55   %  % Time Above 180  24  % Time above 250  19  % Time Below target 2    PRE-MEAL Fasting Lunch Dinner Bedtime Overall  Glucose range:       Mean/median:  188  178  225  236/198    POST-MEAL PC Breakfast PC Lunch PC Dinner  Glucose range:     Mean/median:  178  164  255    Glycemic patterns summary: On an average blood sugars are about the same throughout the day at night but relatively higher between 4-6 PM and lowest around 8-9 AM     CGM use % of time  88  Average and SD  153+/-56  Time in range  73     %  % Time Above 180  20  % Time above 250  6  % Time Below target  #1    Previous data:  PRE-MEAL Fasting Lunch Dinner Bedtime Overall  Glucose range:       Mean/median:  132  184  191  167/148  153   POST-MEAL PC Breakfast PC Lunch PC Dinner  Glucose range:     Mean/median:  151  163  142    Glycemic patterns summary: Blood sugars are occasionally appearing to be not correlating with the fingerstick glucose He has significant variability in blood sugars at times except overnight Although hypoglycemia has been transient he has had 3 episodes in the last 2 weeks Hyperglycemia has been occurring mostly late evening with overall highest blood sugars around 7 PM, 10 PM or midnight    Lab Results  Component Value Date   HGBA1C 7.4 (A) 10/16/2019   HGBA1C 7.1 (A) 04/17/2019   HGBA1C 7.1 (A) 12/17/2018   Lab Results  Component Value  Date   MICROALBUR <0.7 04/17/2019   LDLCALC 76 04/17/2019   CREATININE 1.31 04/17/2019       Allergies as of 10/16/2019   No Known Allergies     Medication List       Accurate as of October 16, 2019 10:05 AM. If you have any questions, ask your nurse or doctor.        atorvastatin 10 MG tablet Commonly known as: LIPITOR TAKE 1 TABLET BY MOUTH EVERY DAY   glucose blood test strip Commonly known as: Banker Next Test Use as instructed to check blood sugar 7-8 times per day dx code E10.65   insulin lispro 100 UNIT/ML injection Commonly known as: HumaLOG INJECT SUBCUTANEOUSLY 50  UNITS VIA INSULIN PUMP  DAILY   lamoTRIgine 100 MG tablet Commonly known as: LAMICTAL Take 500 mg by mouth daily.   Lyumjev 100 UNIT/ML Soln Generic drug: Insulin Lispro-aabc Inject 50 Units as directed daily. Use in insulin pump       Allergies: No Known Allergies  Past Medical History:  Diagnosis Date  . Seizures (HCC) since 2008    No past surgical history on file.  Family History  Problem Relation Age of Onset  . Diabetes Neg Hx   . Heart disease Neg Hx     Social History:  reports that he has never smoked. He has never used smokeless tobacco. No history on file for alcohol use and drug use.  REVIEW of systems:    Has been on Lipitor for hypercholesterolemia, taking this regularly  Lab Results  Component Value Date   CHOL 144 04/17/2019   HDL 59.70 04/17/2019   LDLCALC 76 04/17/2019   TRIG 40.0 04/17/2019   CHOLHDL 2 04/17/2019   Weight has been slightly variable  Wt Readings from Last 3 Encounters:  10/16/19 190 lb 12.8 oz (86.5 kg)  04/17/19 197 lb 6.4 oz (89.5 kg)  12/17/18 194 lb 12.8 oz (88.4 kg)   She has a mildly low sodium of unclear etiology, not on any medications that would cause this   Lab Results  Component Value Date  CREATININE 1.31 04/17/2019   BUN 20 04/17/2019   NA 133 (L) 04/17/2019   K 4.3 04/17/2019   CL 100 04/17/2019   CO2  29 04/17/2019      EXAM:  BP (!) 148/101 (BP Location: Left Arm, Patient Position: Sitting, Cuff Size: Normal)   Pulse 72   Ht 5' 9.25" (1.759 m)   Wt 190 lb 12.8 oz (86.5 kg)   SpO2 96%   BMI 27.97 kg/m       ASSESSMENT:  See history of present illness for detailed analysis of blood sugars, continuous glucose monitoring interpretation and discussion of day-to-day management and problems identified  He usually has an A1c around 7% and now 7.4  He is using the 670 pump in the auto mode  His blood sugars are within target range only 55% of the time because of being on steroids recently Not having excessive hypoglycemia with 2% of readings below normal  Because of his being on prednisone difficult to review his usual blood sugar patterns and management but occurrence and causes of hyperglycemia and hypoglycemia were reviewed  He is not sure if he had better results with Lyumjev sample previously but likely needs to try this since he tends to have higher postprandial readings especially if not bolusing ahead of time  Recommendations:  No change in basal settings as yet  He will call when he finishes his Humalog to switch to Lyumjev  May bolus up to 20 minutes before eating if blood sugars are relatively high  Calibrate at least twice a day consistently on waking up and bedtime and at other times if able to  Avoid suspending the pump when blood sugars are relatively low if he does not see any basal activity  Use temporary target when he is more active at work  Discussed potential for better control with a 780 pump when approved  Given prescription for Baqsimi for any severe hypoglycemic episodes and discussed in detail how this would be used by his coworkers, family member or friend or by himself for severe low sugars   HYPERCHOLESTEROLEMIA: Well-controlled as on the last visit  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

## 2019-10-17 ENCOUNTER — Ambulatory Visit: Payer: BC Managed Care – PPO | Admitting: Endocrinology

## 2019-10-24 ENCOUNTER — Telehealth: Payer: Self-pay | Admitting: Endocrinology

## 2019-10-24 NOTE — Telephone Encounter (Signed)
Called patient spoke to wife- advised of message from MD.  Wife advised this was fine to do-  Will route to Bjorn Loser so she can type letter and have Dr.Kumar sign, and she gave fax number for the letter to be faxed too-  [phone].  Thank you!

## 2019-10-24 NOTE — Telephone Encounter (Signed)
Patient's place of employment called asking why Dr Lucianne Muss was unable to release this patient back to work after his work injury? She said he was injured due to his hypoglycemia and he was not able to return to work until he was evaluated by his Endocrinologist and approved to go back. She would like someone to give her a call at [phone]

## 2019-10-24 NOTE — Telephone Encounter (Signed)
Please see below and advise.

## 2019-10-24 NOTE — Telephone Encounter (Signed)
The forms the company has sent is asking for him to be approved for his job description and no mention of hypoglycemia.  He can return to work from my point of view and we can fax a generic return to work letter that can be signed on Monday

## 2019-10-27 ENCOUNTER — Encounter: Payer: Self-pay | Admitting: Endocrinology

## 2019-10-27 NOTE — Telephone Encounter (Signed)
Letter printed and on your desk for signature.

## 2019-10-31 ENCOUNTER — Encounter: Payer: Self-pay | Admitting: Endocrinology

## 2019-11-07 ENCOUNTER — Other Ambulatory Visit: Payer: Self-pay | Admitting: Endocrinology

## 2019-12-05 ENCOUNTER — Other Ambulatory Visit: Payer: Self-pay | Admitting: Endocrinology

## 2019-12-09 ENCOUNTER — Other Ambulatory Visit: Payer: Self-pay | Admitting: Endocrinology

## 2019-12-09 DIAGNOSIS — R5383 Other fatigue: Secondary | ICD-10-CM

## 2019-12-09 DIAGNOSIS — E871 Hypo-osmolality and hyponatremia: Secondary | ICD-10-CM

## 2019-12-09 DIAGNOSIS — R531 Weakness: Secondary | ICD-10-CM

## 2019-12-10 ENCOUNTER — Other Ambulatory Visit: Payer: Self-pay

## 2019-12-13 LAB — COMPREHENSIVE METABOLIC PANEL
ALT: 26 IU/L (ref 0–44)
AST: 16 IU/L (ref 0–40)
Albumin/Globulin Ratio: 2 (ref 1.2–2.2)
Albumin: 4.5 g/dL (ref 4.0–5.0)
Alkaline Phosphatase: 88 IU/L (ref 44–121)
BUN/Creatinine Ratio: 8 — ABNORMAL LOW (ref 9–20)
BUN: 11 mg/dL (ref 6–24)
Bilirubin Total: 1.5 mg/dL — ABNORMAL HIGH (ref 0.0–1.2)
CO2: 26 mmol/L (ref 20–29)
Calcium: 9.6 mg/dL (ref 8.7–10.2)
Chloride: 102 mmol/L (ref 96–106)
Creatinine, Ser: 1.35 mg/dL — ABNORMAL HIGH (ref 0.76–1.27)
GFR calc Af Amer: 74 mL/min/{1.73_m2} (ref >59)
GFR calc non Af Amer: 64 mL/min/{1.73_m2} (ref >59)
Globulin, Total: 2.2 g/dL (ref 1.5–4.5)
Glucose: 136 mg/dL — ABNORMAL HIGH (ref 65–99)
Potassium: 4.5 mmol/L (ref 3.5–5.2)
Sodium: 140 mmol/L (ref 134–144)
Total Protein: 6.7 g/dL (ref 6.0–8.5)

## 2019-12-13 LAB — CBC WITH DIFFERENTIAL/PLATELET
Basophils Absolute: 0 10*3/uL (ref 0.0–0.2)
Basos: 0 %
EOS (ABSOLUTE): 0 10*3/uL (ref 0.0–0.4)
Eos: 0 %
Hematocrit: 47 % (ref 37.5–51.0)
Hemoglobin: 16.8 g/dL (ref 13.0–17.7)
Immature Grans (Abs): 0 10*3/uL (ref 0.0–0.1)
Immature Granulocytes: 0 %
Lymphocytes Absolute: 1.3 10*3/uL (ref 0.7–3.1)
Lymphs: 33 %
MCH: 31.8 pg (ref 26.6–33.0)
MCHC: 35.7 g/dL (ref 31.5–35.7)
MCV: 89 fL (ref 79–97)
Monocytes Absolute: 0.3 10*3/uL (ref 0.1–0.9)
Monocytes: 8 %
Neutrophils Absolute: 2.3 10*3/uL (ref 1.4–7.0)
Neutrophils: 59 %
Platelets: 176 10*3/uL (ref 150–450)
RBC: 5.29 x10E6/uL (ref 4.14–5.80)
RDW: 11.9 % (ref 11.6–15.4)
WBC: 3.9 10*3/uL (ref 3.4–10.8)

## 2019-12-13 LAB — TSH: TSH: 0.948 u[IU]/mL (ref 0.450–4.500)

## 2019-12-13 LAB — T4, FREE: Free T4: 1.28 ng/dL (ref 0.82–1.77)

## 2019-12-13 LAB — TESTOSTERONE, FREE, TOTAL, SHBG
Sex Hormone Binding: 50.4 nmol/L (ref 16.5–55.9)
Testosterone, Free: 7 pg/mL (ref 6.8–21.5)
Testosterone: 491 ng/dL (ref 264–916)

## 2019-12-14 NOTE — Progress Notes (Signed)
Please call to let patient know that the lab results are normal and needs to see a PCP for fatigue

## 2019-12-21 ENCOUNTER — Other Ambulatory Visit: Payer: Self-pay | Admitting: Endocrinology

## 2020-01-20 ENCOUNTER — Ambulatory Visit: Payer: BC Managed Care – PPO | Admitting: Endocrinology

## 2020-02-03 NOTE — Progress Notes (Addendum)
Tommy Newman is a 45 y.o. male.              Reason for Appointment: Insulin Pump followup:    History of Present Illness   Diagnosis: Type 1 DIABETES MELITUS, date of diagnosis: 1991      DIABETES history: He has been on an insulin pump since 1997  CURRENT insulin pump:  Medtronic 670    Carb Ratio: 1: 15 except 1: 18 at suppertime, correction factor 1: 30 with target 90-125 Active insulin time 3 hours                                       A1c is 6.9, was 7.4.                            GLUCOSE CONTROL with the pump is assessed today by pump  download  He is working night shifts  Current blood sugar patterns, problems identified and current management:  He has been in the auto mode 90 % of the time  Reason for auto mode exits are due to  disabled by user 7 times, high blood sugar  max delivery 2 times  Blood sugars patterns are reviewed from his continuous glucose monitoring records on the pump download which is interpreted as follows: Data for the last 2 weeks using guardian CGM below also  Overnight blood sugars start of relatively high at midnight but usually blood sugars during the midday and early afternoon are more closer to target when he is sleeping without hypoglycemia  HYPERGLYCEMIA occurs about 3 times per day on average, frequently around midnight, occasionally at 3 AM and 9-10 PM Sometimes this is related to rebound from low normal sugars  POSTPRANDIAL blood sugars are generally well controlled with blood sugar relatively flat after meals on an average although he has more variability with the evening meal Preprandial readings periodically are higher before dinner but not consistently However blood sugars are averaging high even before the evening meal  HYPOGLYCEMIA has been occurring less than once a day, has had a few episodes around 2 AM-3 AM as well as 6 AM and 12 noon; these do not appear to be usually related to mealtime boluses although once this  occurred in the manual mode No severe hypoglycemic episodes and overall frequency of hypoglycemia is 2%  Pump management: He has some variability in his blood sugars especially in the evenings and inconsistently high readings before dinnertime for no apparent reason Otherwise A1c is better than usual without hypoglycemia being excessive He occasionally may tend to drop low relatively fast even with the auto mode Appears to be disconnecting his auto mode periodically for short periods of time and not clear why He thinks he is covering all his snacks with boluses for carbohydrates but blood sugars are still the highest around midnight He says he does not consume alcohol He thinks that sometimes he will have low sugars while sleeping but not recently Also not appearing to be using the temporary target when he is more active at work and this can occasionally cause low sugars  He is variably active at work   CGM use % of time  90  2-week average/GV  153+/-51  Time in range  71  % Time Above 180  23  % Time above 250 4  % Time Below 70  2     PRE-MEAL Fasting Lunch Dinner Bedtime Overall  Glucose range:       Averages:  155  115  175  218/197    POST-MEAL PC Breakfast PC Lunch PC Dinner  Glucose range:     Averages:  137  112  169    PREVIOUS data:    CGM use % of time  90  Average and SD  182+/-73  Time in range     55   %  % Time Above 180  24  % Time above 250  19  % Time Below target 2    PRE-MEAL Fasting Lunch Dinner Bedtime Overall  Glucose range:       Mean/median:  188  178  225  236/198    POST-MEAL PC Breakfast PC Lunch PC Dinner  Glucose range:     Mean/median:  178  164  255    Glycemic patterns summary: On an average blood sugars are about the same throughout the day at night but relatively higher between 4-6 PM and lowest around 8-9 AM     CGM use % of time  88  Average and SD  153+/-56  Time in range  73     %  % Time Above 180  20  % Time above 250   6  % Time Below target  #1      Lab Results  Component Value Date   HGBA1C 6.9 (A) 02/04/2020   HGBA1C 7.4 (A) 10/16/2019   HGBA1C 7.1 (A) 04/17/2019   Lab Results  Component Value Date   MICROALBUR <0.7 02/04/2020   LDLCALC 60 02/04/2020   CREATININE 1.04 02/04/2020       Allergies as of 02/04/2020   No Known Allergies      Medication List        Accurate as of February 04, 2020  8:42 PM. If you have any questions, ask your nurse or doctor.          atorvastatin 10 MG tablet Commonly known as: LIPITOR TAKE 1 TABLET BY MOUTH EVERY DAY   Baqsimi Two Pack 3 MG/DOSE Powd Generic drug: Glucagon Spray one dose in nose for serious low sugars   glucose blood test strip Commonly known as: Banker Next Test Use as instructed to check blood sugar 7-8 times per day dx code E10.65   insulin lispro 100 UNIT/ML injection Commonly known as: HumaLOG INJECT SUBCUTANEOUSLY 50  UNITS VIA INSULIN PUMP  DAILY   lamoTRIgine 100 MG tablet Commonly known as: LAMICTAL Take 500 mg by mouth daily.   Lyumjev 100 UNIT/ML Soln Generic drug: Insulin Lispro-aabc Inject 50 Units as directed daily. Use in insulin pump        Allergies: No Known Allergies  Past Medical History:  Diagnosis Date   Seizures (HCC) since 2008    No past surgical history on file.  Family History  Problem Relation Age of Onset   Diabetes Neg Hx    Heart disease Neg Hx     Social History:  reports that he has never smoked. He has never used smokeless tobacco. No history on file for alcohol use and drug use.  REVIEW of systems:    Has been on Lipitor for hypercholesterolemia, taking this regularly  Lab Results  Component Value Date   CHOL 128 02/04/2020   HDL 59.60 02/04/2020   LDLCALC 60 02/04/2020   TRIG 42.0 02/04/2020   CHOLHDL 2 02/04/2020   Weight  history as follows:  Wt Readings from Last 3 Encounters:  02/04/20 195 lb (88.5 kg)  10/16/19 190 lb 12.8 oz (86.5 kg)   04/17/19 197 lb 6.4 oz (89.5 kg)   History of mildly low sodium of unclear etiology, not on any medications that would cause this   Lab Results  Component Value Date   CREATININE 1.04 02/04/2020   BUN 22 02/04/2020   NA 135 02/04/2020   K 4.1 02/04/2020   CL 99 02/04/2020   CO2 33 (H) 02/04/2020      EXAM:  BP 128/84   Pulse (!) 57   Ht 5\' 9"  (1.753 m)   Wt 195 lb (88.5 kg)   BMI 28.80 kg/m       ASSESSMENT:  See history of present illness for detailed analysis of blood sugars, continuous glucose monitoring interpretation and discussion of day-to-day management and problems identified  Currently using Medtronic 670 pump  He usually has an A1c around 7% and now 6.9  Recommendations: No change in basal settings or carbohydrate ratio Will call in prescription for Lyumjev as this will likely be easier to use for postprandial control and help with the timing of the insulin bolus He can continue 3-hour active insulin time since 3 hour blood sugar levels postprandially are fairly stable Reminded him to use the temporary target when he is more active also if he feels like he is starting to get low sugars when sleeping Recommended that he move over to the 770 pump and discussed the features of this as well as the free upgrade to the 780 pump when approved Avoid disconnecting the auto mode arbitrarily   HYPERCHOLESTEROLEMIA: Well-controlled previously and will need follow-up  History of mild stable hyponatremia: Recheck today   Follow-up in 4 months   Tommy Newman 02/04/2020, 8:42 PM

## 2020-02-04 ENCOUNTER — Encounter: Payer: Self-pay | Admitting: Endocrinology

## 2020-02-04 ENCOUNTER — Other Ambulatory Visit: Payer: Self-pay | Admitting: *Deleted

## 2020-02-04 ENCOUNTER — Ambulatory Visit: Payer: BC Managed Care – PPO | Admitting: Endocrinology

## 2020-02-04 ENCOUNTER — Other Ambulatory Visit: Payer: Self-pay

## 2020-02-04 VITALS — BP 128/84 | HR 57 | Ht 69.0 in | Wt 195.0 lb

## 2020-02-04 DIAGNOSIS — E1065 Type 1 diabetes mellitus with hyperglycemia: Secondary | ICD-10-CM

## 2020-02-04 DIAGNOSIS — E785 Hyperlipidemia, unspecified: Secondary | ICD-10-CM

## 2020-02-04 LAB — BASIC METABOLIC PANEL
BUN: 22 mg/dL (ref 6–23)
CO2: 33 mEq/L — ABNORMAL HIGH (ref 19–32)
Calcium: 9.9 mg/dL (ref 8.4–10.5)
Chloride: 99 mEq/L (ref 96–112)
Creatinine, Ser: 1.04 mg/dL (ref 0.40–1.50)
GFR: 88.15 mL/min (ref >60.00)
Glucose, Bld: 175 mg/dL — ABNORMAL HIGH (ref 70–99)
Potassium: 4.1 mEq/L (ref 3.5–5.1)
Sodium: 135 mEq/L (ref 135–145)

## 2020-02-04 LAB — POCT GLYCOSYLATED HEMOGLOBIN (HGB A1C): Hemoglobin A1C: 6.9 % — AB (ref 4.0–5.6)

## 2020-02-04 LAB — LIPID PANEL
Cholesterol: 128 mg/dL (ref 0–200)
HDL: 59.6 mg/dL (ref >39.00)
LDL Cholesterol: 60 mg/dL (ref 0–99)
NonHDL: 68.59
Total CHOL/HDL Ratio: 2
Triglycerides: 42 mg/dL (ref 0.0–149.0)
VLDL: 8.4 mg/dL (ref 0.0–40.0)

## 2020-02-04 LAB — MICROALBUMIN / CREATININE URINE RATIO
Creatinine,U: 38.2 mg/dL
Microalb Creat Ratio: 1.8 mg/g (ref 0.0–30.0)
Microalb, Ur: <0.7 mg/dL (ref 0.0–1.9)

## 2020-02-04 MED ORDER — LYUMJEV 100 UNIT/ML IJ SOLN: 50.0000 [IU] | Freq: Every day | INTRAMUSCULAR | 1 refills | Status: DC

## 2020-05-12 ENCOUNTER — Other Ambulatory Visit: Payer: Self-pay | Admitting: *Deleted

## 2020-05-12 MED ORDER — LYUMJEV 100 UNIT/ML IJ SOLN: 50.0000 [IU] | Freq: Every day | INTRAMUSCULAR | 2 refills | Status: DC

## 2020-05-15 ENCOUNTER — Other Ambulatory Visit: Payer: Self-pay | Admitting: Endocrinology

## 2020-06-10 ENCOUNTER — Ambulatory Visit: Payer: BC Managed Care – PPO | Admitting: Endocrinology

## 2020-06-10 ENCOUNTER — Encounter: Payer: Self-pay | Admitting: Endocrinology

## 2020-06-10 ENCOUNTER — Other Ambulatory Visit: Payer: Self-pay

## 2020-06-10 VITALS — BP 130/80 | HR 62 | Ht 69.0 in | Wt 192.5 lb

## 2020-06-10 DIAGNOSIS — E1065 Type 1 diabetes mellitus with hyperglycemia: Secondary | ICD-10-CM | POA: Diagnosis not present

## 2020-06-10 LAB — POCT GLYCOSYLATED HEMOGLOBIN (HGB A1C): Hemoglobin A1C: 7.6 % — AB (ref 4.0–5.6)

## 2020-06-10 NOTE — Progress Notes (Signed)
Tommy Newman is a 45 y.o. male.              Reason for Appointment: Insulin Pump followup:    History of Present Illness   Diagnosis: Type 1 DIABETES MELITUS, date of diagnosis: 1991      DIABETES history: He has been on an insulin pump since 1997  CURRENT insulin pump:  Medtronic 670    Carb Ratio: 1: 15 except 1: 18 at suppertime, correction factor 1: 30 with target 90-125 Active insulin time 3 hours                                       A1c is 7.6, previously 6.9                            GLUCOSE CONTROL with the pump is assessed today by pump  download  He is working night shifts  Current blood sugar patterns, problems identified and current management:  He has been in the auto mode 94 % of the time  Reason for auto mode exits are due to  disabled by user, sensor algorithm under read and max delivery  Blood sugars patterns are reviewed from his continuous glucose monitoring records on the pump download which is interpreted as follows:  Overall blood sugars are generally higher overnight and variably in the evenings and tend to be the lowest early morning or late afternoon  HYPERGLYCEMIA occurs about 3 times per day on average, notably at 5-6 PM, 2-4 AM and 8-9 PM Sometimes this is related to rebound from low normal sugars  POSTPRANDIAL blood sugars are showing significant increase in blood sugars at times and not clear if this is related to some missed or late boluses, evening patterns indicate that blood sugars are rising before boluses effective  HYPOGLYCEMIA has been occurring less than once a day, some episodes appear to be related to overcorrection from high readings but not clearly related to increased activity.  Usually hypoglycemia is transient   No severe hypoglycemic episodes and overall has 3% of readings below 70  Insulin and pump management:  Even with using Lyumjev insulin his postprandial readings are inconsistent. Blood sugars are rising  excessively at times after meals as discussed above based on bolus timing Also he is trying to use a temporary target frequently but he still feels that the blood sugar may get low normal even with this Today he had a half pop tart because her sugar was 80 but he did not bolus causing the sugar to be over 300 Not clear if he may be underestimating his carbohydrates occasionally Usually not eating breakfast However does tend to be at times over correcting high readings with the current pump settings He says that he is not eligible for the new pump until next May No exercise and is mostly active at work with variable intensity  CGM use % of time 93  2-week average/GV 161+/-52.  GMI 7.2  Time in range    63    %  % Time Above 180 28  % Time above 250 6  % Time Below 70 3     PRE-MEAL Fasting Lunch Dinner Bedtime Overall  Glucose range:       Averages: 158  174     POST-MEAL PC Breakfast PC Lunch PC Dinner  Glucose range:  Averages: 196  166   Previous data:   CGM use % of time  90  2-week average/GV  153+/-51  Time in range  71  % Time Above 180  23  % Time above 250 4  % Time Below 70 2     PRE-MEAL Fasting Lunch Dinner Bedtime Overall  Glucose range:       Averages:  155  115  175  218/197    POST-MEAL PC Breakfast PC Lunch PC Dinner  Glucose range:     Averages:  137  112  169      Lab Results  Component Value Date   HGBA1C 7.6 (A) 06/10/2020   HGBA1C 6.9 (A) 02/04/2020   HGBA1C 7.4 (A) 10/16/2019   Lab Results  Component Value Date   MICROALBUR <0.7 02/04/2020   LDLCALC 60 02/04/2020   CREATININE 1.04 02/04/2020       Allergies as of 06/10/2020   No Known Allergies      Medication List        Accurate as of June 10, 2020 11:51 AM. If you have any questions, ask your nurse or doctor.          STOP taking these medications    Baqsimi Two Pack 3 MG/DOSE Powd Generic drug: Glucagon Stopped by: Reather Littler, MD   insulin lispro 100 UNIT/ML  injection Commonly known as: HumaLOG Stopped by: Reather Littler, MD       TAKE these medications    atorvastatin 10 MG tablet Commonly known as: LIPITOR TAKE 1 TABLET BY MOUTH EVERY DAY   glucose blood test strip Commonly known as: Banker Next Test Use as instructed to check blood sugar 7-8 times per day dx code E10.65   lamoTRIgine 100 MG tablet Commonly known as: LAMICTAL Take 400 mg by mouth daily.   Lyumjev 100 UNIT/ML Soln Generic drug: Insulin Lispro-aabc Inject 50 Units as directed daily. Use in insulin pump        Allergies: No Known Allergies  Past Medical History:  Diagnosis Date   Seizures (HCC) since 2008    No past surgical history on file.  Family History  Problem Relation Age of Onset   Diabetes Neg Hx    Heart disease Neg Hx     Social History:  reports that he has never smoked. He has never used smokeless tobacco. No history on file for alcohol use and drug use.  REVIEW of systems:    Has been on Lipitor 10 mg consistently for hypercholesterolemia, with good control  Lab Results  Component Value Date   CHOL 128 02/04/2020   HDL 59.60 02/04/2020   LDLCALC 60 02/04/2020   TRIG 42.0 02/04/2020   CHOLHDL 2 02/04/2020    Previously had reduced sodium levels of unclear etiology  Lab Results  Component Value Date   CREATININE 1.04 02/04/2020   BUN 22 02/04/2020   NA 135 02/04/2020   K 4.1 02/04/2020   CL 99 02/04/2020   CO2 33 (H) 02/04/2020      EXAM:  BP 130/80   Pulse 62   Ht 5\' 9"  (1.753 m)   Wt 192 lb 8 oz (87.3 kg)   SpO2 97%   BMI 28.43 kg/m       ASSESSMENT:  See history of present illness for detailed analysis of blood sugars, continuous glucose monitoring interpretation and discussion of day-to-day management and problems identified  Currently using Medtronic 670 pump  He usually has an A1c around  7% and now higher at 7.6  Unclear why his blood sugars have been tending to be labile with periodic  hyperglycemic episodes and also tendency to low normal or low sugars at times Some of those hyperglycemia likely related to late or inadequate boluses as he appears to have acute rise in blood sugar with any carbohydrate intake Not benefiting clearly from taking Lyumjev However may be getting tendency to some overcorrection with current sensitivity of 1: 30 Even with using temporary target he says he has a tendency to getting low sugars as seen on his CGM download at times also  Recommendations: For now we will continue Lyumjev Since he has ability to upgrade he can try to look into the 770 pump Make sure he tries to BOLUS 5 to 10 minutes before eating at least to avoid postprandial hypoglycemia He will try to bolus even when blood sugars are normal or low normal by entering the blood sugar level and carbohydrate content at that time.  Carbohydrate ratio 1:16 after 4 PM. Sensitivity 1: 40 but if he has difficulty getting adequate correction he can change this to 35 Active insulin time 3-1/2 hours instead of 3 hours which may reduce tendency to relatively lower sugars Continue to use temporary target when more active especially at work   Follow-up in 4 months   Reather Littler 06/10/2020, 11:51 AM

## 2020-06-10 NOTE — Patient Instructions (Signed)
Bolus 5-10 min before meals  Call [employer] re: new pump

## 2020-06-28 NOTE — Telephone Encounter (Signed)
[employer] called to see if we got another fax request on 06/25/20 because the first one we sent was "invalid" - please advise.

## 2020-06-29 NOTE — Telephone Encounter (Signed)
I have not received anything back from [employer] stating last fax sent was invalid. Will resend fax.

## 2020-07-22 ENCOUNTER — Telehealth: Payer: Self-pay | Admitting: Endocrinology

## 2020-07-22 NOTE — Telephone Encounter (Signed)
Pt called wanting to speak to the nurse regarding "prescription follow up" I did inform pt I could help him with that but he wanted to speak to the nurse. I then let him know that the nurse is with pt's so she will have to give him a call back.  [phone]

## 2020-07-22 NOTE — Telephone Encounter (Signed)
Sharma Covert from [employer] called checking to see if we received the fax they sent on 7/18 for pt. I let them know it will take 7-10 business days for the forms to be completed. They state they will check back in 3 days.

## 2020-07-26 NOTE — Telephone Encounter (Signed)
I called patient back. No answer left voicemail for him to callback

## 2020-07-26 NOTE — Telephone Encounter (Signed)
Mark with [employer] called regarding the pt's message below. He states we need to resend the CMN form for the pt and check the box that states "minimed 770 G system" and fax back to them.  [employer] fax [phone]

## 2020-07-26 NOTE — Telephone Encounter (Signed)
Fax was sent on 07/23/20 for pump.

## 2020-07-26 NOTE — Telephone Encounter (Signed)
Patient called me back informed him that we have completed, signed, and faxed over everything sent to [place] from [employer]. Informed him that I just faxed over another form on 07/23/20

## 2020-07-26 NOTE — Telephone Encounter (Signed)
Fax was sent on 07/23/20

## 2020-08-20 ENCOUNTER — Telehealth: Payer: Self-pay | Admitting: Nutrition

## 2020-08-20 NOTE — Telephone Encounter (Signed)
LVM on times I have available on Tues., and Wednesday of next week and number to call me back to confirm

## 2020-08-24 ENCOUNTER — Encounter: Payer: BC Managed Care – PPO | Attending: Endocrinology | Admitting: Nutrition

## 2020-08-25 ENCOUNTER — Telehealth: Payer: Self-pay | Admitting: Nutrition

## 2020-08-25 NOTE — Telephone Encounter (Signed)
Lvm to call me to schedule pump training 

## 2020-09-13 ENCOUNTER — Ambulatory Visit: Payer: BC Managed Care – PPO | Admitting: Nutrition

## 2020-09-23 NOTE — Progress Notes (Signed)
Pateint was trained on how to use the [employer] 770 G pump and new sensors.  Settings were transferred by the patient per his old 670G pump We discussed the differences in this pump from the old model and he reported good understanding of this. He was again shown how to apply, and tape the new sensors, and we linked his sensor to his pump.  We reviewed the need to /and how to calibrate the sensors  He signed the checklist as understanding all topics with no final questions.

## 2020-09-23 NOTE — Patient Instructions (Signed)
Read over booklets on pump usage and CGM usage.  Call [employer] if questions.

## 2020-10-14 ENCOUNTER — Ambulatory Visit: Payer: BC Managed Care – PPO | Admitting: Endocrinology

## 2020-10-14 ENCOUNTER — Encounter: Payer: Self-pay | Admitting: Endocrinology

## 2020-10-14 ENCOUNTER — Other Ambulatory Visit: Payer: Self-pay

## 2020-10-14 VITALS — BP 122/85 | HR 69 | Ht 68.0 in | Wt 190.4 lb

## 2020-10-14 DIAGNOSIS — E1065 Type 1 diabetes mellitus with hyperglycemia: Secondary | ICD-10-CM

## 2020-10-14 DIAGNOSIS — E785 Hyperlipidemia, unspecified: Secondary | ICD-10-CM

## 2020-10-14 LAB — LIPID PANEL
Cholesterol: 136 mg/dL (ref 0–200)
HDL: 64.7 mg/dL (ref >39.00)
LDL Cholesterol: 64 mg/dL (ref 0–99)
NonHDL: 71.72
Total CHOL/HDL Ratio: 2
Triglycerides: 41 mg/dL (ref 0.0–149.0)
VLDL: 8.2 mg/dL (ref 0.0–40.0)

## 2020-10-14 LAB — POCT GLYCOSYLATED HEMOGLOBIN (HGB A1C): Hemoglobin A1C: 7.5 % — AB (ref 4.0–5.6)

## 2020-10-14 LAB — COMPREHENSIVE METABOLIC PANEL
ALT: 28 U/L (ref 0–53)
AST: 22 U/L (ref 0–37)
Albumin: 4.2 g/dL (ref 3.5–5.2)
Alkaline Phosphatase: 64 U/L (ref 39–117)
BUN: 16 mg/dL (ref 6–23)
CO2: 31 mEq/L (ref 19–32)
Calcium: 9.2 mg/dL (ref 8.4–10.5)
Chloride: 101 mEq/L (ref 96–112)
Creatinine, Ser: 1.06 mg/dL (ref 0.40–1.50)
GFR: 85.74 mL/min (ref >60.00)
Glucose, Bld: 172 mg/dL — ABNORMAL HIGH (ref 70–99)
Potassium: 4.4 mEq/L (ref 3.5–5.1)
Sodium: 135 mEq/L (ref 135–145)
Total Bilirubin: 1.6 mg/dL — ABNORMAL HIGH (ref 0.2–1.2)
Total Protein: 6.5 g/dL (ref 6.0–8.3)

## 2020-10-14 NOTE — Patient Instructions (Addendum)
Bolus 10-15 min before meals  Ok to get Covid booster

## 2020-10-14 NOTE — Progress Notes (Signed)
Tommy Newman is a 45 y.o. male.              Reason for Appointment: Insulin Pump followup:    History of Present Illness   Diagnosis: Type 1 DIABETES MELITUS, date of diagnosis: 1991      DIABETES history: He has been on an insulin pump since 1997  CURRENT insulin pump:  [employer] 770   Carb Ratio: 1: 15 except 1: 18 at suppertime, correction factor 1: 40 with target 90-125 Active insulin time 3 hours                                       A1c is 7.4                            GLUCOSE CONTROL with the pump is assessed today by pump  download  He is working day shifts recently  Current blood sugar patterns, problems identified and current management:  He has been in the auto mode 95 % of the time  Reason for auto mode exits are due to high blood sugars, max delivery or disabled by user only once  Blood sugars patterns are reviewed from his continuous glucose monitoring records on the pump download which is interpreted as follows:  Moderate variability is present in his blood sugars especially in the late evening hours.  Also overall standard deviation is 61  Has on an average of blood sugars are mostly well controlled during the daytime does seem to be periodically higher overnight also with some variability  Hypoglycemia has been infrequent and transient and occasionally occurring late afternoon or late evening and periodically during the day yesterday  POSTPRANDIAL blood sugars are generally not higher than Premeal blood sugars  Postprandial readings appear to be the most variable after dinner and occasionally significantly high However his blood sugars appear to be rising generally before his bolus is delivered  Occasionally may have low sugars by about 2 hours after the bolus but at 3 hours blood sugars are generally relatively flat   HYPERGLYCEMIA documented 14 times in the last 2 weeks mostly between 1-2 PM and also occasionally following low normal  readings    No severe hypoglycemic episodes and overall has 3% of readings below 70  Insulin and pump management:  He thinks he is trying to bolus 5 to 10 minutes before eating although his blood sugars appear to be rising before the boluses delivered as above  Frequently will have a significant rise in blood sugar right after eating but not consistently, this may be more at lunchtime  However he thinks he is getting some protein with every meal including peanut butter at breakfast  Continues to Lyumjev but not clear if he is consistently benefiting from this  He thinks his blood sugars tend to come down more quickly with using the 770 pump No exercise and is mostly active at work which is not appearing to be dropping his blood sugar  CGM use % of time 92  2-week average/GV 173+/-61  Time in range      59%  % Time Above 180 29+11  % Time above 250   % Time Below 70 1     PRE-MEAL Fasting Lunch Dinner Bedtime Overall  Glucose range:       Averages: 160 188 197     POST-MEAL  PC Breakfast PC Lunch PC Dinner  Glucose range:     Averages: 149 161 181   PREVIOUSLY:  CGM use % of time 93  2-week average/GV 161+/-52.  GMI 7.2  Time in range    63    %  % Time Above 180 28  % Time above 250 6  % Time Below 70 3     PRE-MEAL Fasting Lunch Dinner Bedtime Overall  Glucose range:       Averages: 158  174     POST-MEAL PC Breakfast PC Lunch PC Dinner  Glucose range:     Averages: 196  166    Lab Results  Component Value Date   HGBA1C 7.5 (A) 10/14/2020   HGBA1C 7.6 (A) 06/10/2020   HGBA1C 6.9 (A) 02/04/2020   Lab Results  Component Value Date   MICROALBUR <0.7 02/04/2020   LDLCALC 64 10/14/2020   CREATININE 1.06 10/14/2020       Allergies as of 10/14/2020   No Known Allergies      Medication List        Accurate as of October 14, 2020 12:56 PM. If you have any questions, ask your nurse or doctor.          atorvastatin 10 MG tablet Commonly known as:  LIPITOR TAKE 1 TABLET BY MOUTH EVERY DAY   glucose blood test strip Commonly known as: Banker Next Test Use as instructed to check blood sugar 7-8 times per day dx code E10.65   lamoTRIgine 100 MG tablet Commonly known as: LAMICTAL Take 400 mg by mouth daily.   Lyumjev 100 UNIT/ML Soln Generic drug: Insulin Lispro-aabc Inject 50 Units as directed daily. Use in insulin pump        Allergies: No Known Allergies  Past Medical History:  Diagnosis Date   Seizures (HCC) since 2008    No past surgical history on file.  Family History  Problem Relation Age of Onset   Diabetes Neg Hx    Heart disease Neg Hx     Social History:  reports that he has never smoked. He has never used smokeless tobacco. No history on file for alcohol use and drug use.  REVIEW of systems:    Has been on Lipitor 10 mg consistently for hypercholesterolemia with good control  Lab Results  Component Value Date   CHOL 136 10/14/2020   HDL 64.70 10/14/2020   LDLCALC 64 10/14/2020   TRIG 41.0 10/14/2020   CHOLHDL 2 10/14/2020    Previously had reduced sodium levels of unclear etiology  Lab Results  Component Value Date   CREATININE 1.06 10/14/2020   BUN 16 10/14/2020   NA 135 10/14/2020   K 4.4 10/14/2020   CL 101 10/14/2020   CO2 31 10/14/2020   Blood pressure is relatively higher although better on second measurement   BP Readings from Last 3 Encounters:  10/14/20 122/85  06/10/20 130/80  02/04/20 128/84     EXAM:  BP 122/85   Pulse 69   Ht 5\' 8"  (1.727 m)   Wt 190 lb 6.4 oz (86.4 kg)   SpO2 98%   BMI 28.95 kg/m      Diabetic Foot Exam - Simple   Simple Foot Form Diabetic Foot exam was performed with the following findings: Yes   Visual Inspection No deformities, no ulcerations, no other skin breakdown bilaterally: Yes Sensation Testing Intact to touch and monofilament testing bilaterally: Yes Pulse Check Posterior Tibialis and Dorsalis pulse intact  bilaterally: Yes Comments      ASSESSMENT:  See history of present illness for detailed analysis of blood sugars, continuous glucose monitoring interpretation and discussion of day-to-day management and problems identified  Now using [employer] 770 pump  A1c is slightly better at 7.4  He still has increased blood sugars especially postprandially at times but not consistently  Although he thinks he is getting too many low sugars during the day this appears to be only evident yesterday and overall 1% below 70 on his CGM  He does have high readings when he is about to change his infusion set especially late evening  May be somewhat more insulin sensitive the day after changing the set   He thinks he is trying to bolus ahead of time before eating but his blood sugars tend to rise before the bolus is active and may come down sometimes excessively 1 to 2 hours later  However overall correction factor appears to be fairly appropriate Active insulin time 3 hours appears appropriate  Recommendations: He will continue Lyumjev He will now try to bolus 10 to 15-minute before eating especially if it is a higher carb meal and blood sugars are relatively higher before eating Make sure he tries to BOLUS 5 to 10 minutes before eating at least to avoid postprandial hypoglycemia Will discuss his abnormal patterns with the diabetes educator from [employer] No change in pump settings as yet  Recheck lipids and chemistry today  Follow-up in 4 months   Allaina Brotzman 10/14/2020, 12:56 PM

## 2020-12-15 ENCOUNTER — Other Ambulatory Visit: Payer: Self-pay | Admitting: Endocrinology

## 2020-12-31 ENCOUNTER — Other Ambulatory Visit: Payer: Self-pay | Admitting: Endocrinology

## 2021-01-31 ENCOUNTER — Encounter: Payer: Self-pay | Admitting: Endocrinology

## 2021-02-23 ENCOUNTER — Ambulatory Visit: Payer: BC Managed Care – PPO | Admitting: Endocrinology

## 2021-03-22 ENCOUNTER — Ambulatory Visit: Payer: BC Managed Care – PPO | Admitting: Endocrinology

## 2021-03-22 ENCOUNTER — Other Ambulatory Visit: Payer: Self-pay

## 2021-03-22 ENCOUNTER — Encounter: Payer: Self-pay | Admitting: Endocrinology

## 2021-03-22 VITALS — BP 122/82 | HR 60 | Ht 68.0 in | Wt 188.6 lb

## 2021-03-22 DIAGNOSIS — E1065 Type 1 diabetes mellitus with hyperglycemia: Secondary | ICD-10-CM | POA: Diagnosis not present

## 2021-03-22 LAB — POCT GLYCOSYLATED HEMOGLOBIN (HGB A1C): Hemoglobin A1C: 7.3 % — AB (ref 4.0–5.6)

## 2021-03-22 LAB — BASIC METABOLIC PANEL
BUN: 17 mg/dL (ref 6–23)
CO2: 29 mEq/L (ref 19–32)
Calcium: 9.4 mg/dL (ref 8.4–10.5)
Chloride: 101 mEq/L (ref 96–112)
Creatinine, Ser: 1.04 mg/dL (ref 0.40–1.50)
GFR: 87.45 mL/min (ref >60.00)
Glucose, Bld: 87 mg/dL (ref 70–99)
Potassium: 4.1 mEq/L (ref 3.5–5.1)
Sodium: 136 mEq/L (ref 135–145)

## 2021-03-22 LAB — MICROALBUMIN / CREATININE URINE RATIO
Creatinine,U: 49 mg/dL
Microalb Creat Ratio: 1.4 mg/g (ref 0.0–30.0)
Microalb, Ur: <0.7 mg/dL (ref 0.0–1.9)

## 2021-03-22 NOTE — Progress Notes (Signed)
Tommy Newman is a 46 y.o. male.  ? ?       ? ? ? ? ?Reason for Appointment: Insulin Pump followup:  ? ? ?History of Present Illness  ? ?Diagnosis: Type 1 DIABETES MELITUS, date of diagnosis: 1991     ? ?DIABETES history: He has been on an insulin pump since 1997 ? ?CURRENT insulin pump:  [employer] 770 ? ? ?Carb Ratio: 1: 15 except 1: 18 at suppertime, correction factor 1: 40 with target 90-125 ?Active insulin time 3 hours  ? ?                                    ?A1c is 7 .3 compared to 7.5 ?                           ?GLUCOSE CONTROL with the pump is assessed today by pump  download ? ?He is working day shifts  ? ?Current blood sugar patterns, problems identified and current management: ? ?He has been in the auto mode 96 % of the time ? ?Reason for auto mode exits are due to high blood sugars, min delivery or sensor issues twice ? ?Blood sugars patterns are reviewed from his continuous glucose monitoring records on the pump download which is interpreted as follows:  ?HYPERGLYCEMIA is mostly seen in the late afternoon but also to some degree late evening and overnight  ?More variability is present overnight but some at all times with standard deviation about the same as before at 62  ?OVERNIGHT blood sugars are averaging 180 at midnight but show significant variability until morning when they are averaging close to 150   ?Hypoglycemia has been present periodically overnight, midday or around dinnertime periodically, sometimes postprandially  ?and transient and occasionally occurring late afternoon or late evening and periodically during the day yesterday  ?POSTPRANDIAL blood sugars are on an average similar to Premeal readings ?However blood sugars after lunch are generally trending higher by 3 hours and at times may be well over 200 ?After dinner however blood sugars are on an even level with some tendency to late night high blood sugars  ? ?HYPERGLYCEMIA documented 8 times in the last 2 weeks mostly between 1-2  PM and also 6-7 PM ?TIME IN RANGE = 65% ?26% over 180 and 6% over 250 ?3% below 70 ? ?Insulin and pump management: ? ?He appears to have more difficulty with controlling blood sugars in the afternoon after lunch and then may be relatively better right after the bolus but then appeared to go up significantly by 3 to 4 hours requiring correction  ?Also at times has had to take correction doses late at night or overnight  ?Again does not think he has usually high fat meals ?His A1c may have been slightly better with taking Humalog previously ?He is not needing to use the temporary target as he does not have much heavy physical activity including at work ?Usually trying to bolus before starting the meal but at lunchtime sometimes blood sugars may go up relatively quickly ? ?AVERAGES pre-meal/postmeal: ?Breakfast 145/100 ?Lunch 172/191 ?Dinner 171/159 ? ? ?CGM use % of time 92  ?2-week average/GV 173+/-61  ?Time in range      59%  ?% Time Above 180 29+11  ?% Time above 250   ?% Time Below 70 1  ? ?  ?PRE-MEAL Fasting Lunch  Dinner Bedtime Overall  ?Glucose range:       ?Averages: 160 188 197    ? ?POST-MEAL PC Breakfast PC Lunch PC Dinner  ?Glucose range:     ?Averages: 149 161 181  ? ? ? ? ?Lab Results  ?Component Value Date  ? HGBA1C 7.3 (A) 03/22/2021  ? HGBA1C 7.5 (A) 10/14/2020  ? HGBA1C 7.6 (A) 06/10/2020  ? ?Lab Results  ?Component Value Date  ? MICROALBUR <0.7 02/04/2020  ? [place] 64 10/14/2020  ? CREATININE 1.06 10/14/2020  ? ? ? ? ? ?Allergies as of 03/22/2021   ?No Known Allergies ?  ? ?  ?Medication List  ?  ? ?  ? Accurate as of March 22, 2021  9:44 AM. If you have any questions, ask your nurse or doctor.  ?  ?  ? ?  ? ?atorvastatin 10 MG tablet ?Commonly known as: LIPITOR ?TAKE 1 TABLET BY MOUTH EVERY DAY ?  ?glucose blood test strip ?Commonly known as: Visual merchandiser Next Test ?Use as instructed to check blood sugar 7-8 times per day dx code E10.65 ?  ?lamoTRIgine 100 MG tablet ?Commonly known as:  LAMICTAL ?Take 400 mg by mouth daily. ?  ?Lyumjev 100 UNIT/ML Soln ?Generic drug: Insulin Lispro-aabc ?INJECT SUBCUTANEOUSLY 50  UNITS AS DIRECTED DAILY USE IN INSULIN PUMP ?  ? ?  ? ? ?Allergies: No Known Allergies ? ?Past Medical History:  ?Diagnosis Date  ? Seizures ([facility]) since 2008  ? ? ?No past surgical history on file. ? ?Family History  ?Problem Relation Age of Onset  ? Diabetes Neg Hx   ? Heart disease Neg Hx   ? ? ?Social History:  reports that he has never smoked. He has never used smokeless tobacco. No history on file for alcohol use and drug use. ? ?REVIEW of systems:  ?  ?Has been on Lipitor 10 mg consistently for hypercholesterolemia with good control of LDL ? ?Lab Results  ?Component Value Date  ? CHOL 136 10/14/2020  ? HDL 64.70 10/14/2020  ? [place] 64 10/14/2020  ? TRIG 41.0 10/14/2020  ? CHOLHDL 2 10/14/2020  ? ? ?No recent issues with hyponatremia ? ?Lab Results  ?Component Value Date  ? CREATININE 1.06 10/14/2020  ? BUN 16 10/14/2020  ? NA 135 10/14/2020  ? K 4.4 10/14/2020  ? CL 101 10/14/2020  ? CO2 31 10/14/2020  ? ?No history of hypertension ? ? ?BP Readings from Last 3 Encounters:  ?03/22/21 122/82  ?10/14/20 122/85  ?06/10/20 130/80  ? ? ? ?EXAM: ? ?BP 122/82   Pulse 60   Ht 5\' 8"  (1.727 m)   Wt 188 lb 9.6 oz (85.5 kg)   SpO2 95%   BMI 28.68 kg/m?  ?  ?  ? ?ASSESSMENT: ? ?See history of present illness for detailed analysis of blood sugars, continuous glucose monitoring interpretation and discussion of day-to-day management and problems identified ? ?He is using [employer] 770 pump ? ?A1c is slightly better at 7.3 ? ?He still has increased blood sugars especially postprandially after lunch ?Appears that because of the use of Lyumjev his blood sugars may tend to get higher late afternoon even with the diet not being high fat usually ?Also may occasionally get late night hyperglycemia after dinner ? ?Although he thinks he is getting too many low sugars during the day this appears to be  only evident yesterday and overall 1% below 70 on his CGM  ?He does have high readings when he is  about to change his infusion set especially late evening  ?May be somewhat more insulin sensitive the day after changing the set  ? ?He thinks he is trying to bolus ahead of time before eating but his blood sugars tend to rise before the bolus is active and may come down sometimes excessively 1 to 2 hours later  ?However overall correction factor appears to be fairly appropriate ?Active insulin time 3 hours appears appropriate ? ?Recommendations: ?He will stop Lyumjev and try Humalog again ? ?Reminded him to bolus 10 to 15-minute before eating  ?Continue to use correction boluses or additional boluses with carbohydrate entry if he has high readings 2 to 3 hours later ?If it is a higher carb meal and blood sugars are relatively higher before eating ? ?Consider starting an exercise program ?Continue same carbohydrate coverage and correction ? ?Follow-up chemistry and microalbumin today ? ?Follow-up in 4 months ? ? ?Elayne Snare ?03/22/2021, 9:44 AM  ? ? ?

## 2021-03-22 NOTE — Patient Instructions (Signed)
May try 1:12 carb at lunch if 2 hr sugar stays over 180 a lot ?

## 2021-03-23 MED ORDER — INSULIN LISPRO 100 UNIT/ML IJ SOLN
INTRAMUSCULAR | 2 refills | Status: DC
Start: 2021-03-23 — End: 2021-12-01

## 2021-03-24 ENCOUNTER — Encounter: Payer: Self-pay | Admitting: Endocrinology

## 2021-04-13 ENCOUNTER — Encounter: Payer: Self-pay | Admitting: Endocrinology

## 2021-06-14 ENCOUNTER — Encounter: Payer: Self-pay | Admitting: Endocrinology

## 2021-06-15 ENCOUNTER — Encounter: Payer: Self-pay | Admitting: Endocrinology

## 2021-06-16 ENCOUNTER — Other Ambulatory Visit: Payer: Self-pay | Admitting: Endocrinology

## 2021-07-19 ENCOUNTER — Encounter: Payer: Self-pay | Admitting: Endocrinology

## 2021-07-19 DIAGNOSIS — E1065 Type 1 diabetes mellitus with hyperglycemia: Secondary | ICD-10-CM

## 2021-07-21 MED ORDER — LAMOTRIGINE 200 MG PO TABS: 200.0000 mg | ORAL_TABLET | Freq: Two times a day (BID) | ORAL | 0 refills | Status: AC

## 2021-07-21 NOTE — Addendum Note (Signed)
Addended by: Eden Lathe on: 07/21/2021 09:11 AM   Modules accepted: Orders

## 2021-07-27 ENCOUNTER — Encounter: Payer: Self-pay | Admitting: Endocrinology

## 2021-08-04 ENCOUNTER — Ambulatory Visit: Payer: BC Managed Care – PPO | Admitting: Endocrinology

## 2021-08-04 ENCOUNTER — Encounter: Payer: Self-pay | Admitting: Endocrinology

## 2021-08-04 VITALS — BP 138/80 | HR 63 | Ht 68.0 in | Wt 183.2 lb

## 2021-08-04 DIAGNOSIS — E1065 Type 1 diabetes mellitus with hyperglycemia: Secondary | ICD-10-CM | POA: Diagnosis not present

## 2021-08-04 LAB — POCT GLYCOSYLATED HEMOGLOBIN (HGB A1C): Hemoglobin A1C: 8.4 % — AB (ref 4.0–5.6)

## 2021-08-04 NOTE — Progress Notes (Signed)
Tommy Newman is a 46 y.o. male.              Reason for Appointment: Insulin Pump followup:    History of Present Illness   Diagnosis: Type 1 DIABETES MELITUS, date of diagnosis: 1991      DIABETES history: He has been on an insulin pump since 1997  CURRENT insulin pump:  [employer] 770   Carb Ratio: 1: 15 except 1: 18 at suppertime, correction factor 1: 40 with target 90-125 Active insulin time 3.5 hours                                       A1c is 8.4, previously 7 .3 compared to 7.5                            GLUCOSE CONTROL with the pump is assessed today by pump  download  He is working day shifts   Current blood sugar patterns, problems identified and current management:  He has been in the auto mode 91 % of the time  Reason for auto mode exits are due to high blood sugars, min delivery or sensor issues twice  Blood sugars patterns are reviewed from his continuous glucose monitoring records for the last 2 weeks on the pump download which is interpreted as follows:  HYPERGLYCEMIA is periodically seen at different times but mostly late evening, sometimes overnight and sporadically mid afternoon  OVERNIGHT blood sugars start off averaging about 180 and then progressively improved to the mornings with mild variability and no hypoglycemia  All HYPOGLYCEMIA has been infrequent and only on occasion has occurred late afternoon or late morning preceded by boluses POSTPRANDIAL blood sugars are on an average not rising compared to Premeal readings although overall higher more likely to be after 10 PM Occasionally after breakfast or lunch blood sugars may be relatively low postprandially   Insulin and pump management:  He is ready to get the 780 pump but has not received the sensors  As before he has some fluctuation in his blood sugars and difficulty getting consistent postprandial control  However blood sugars are not as consistently high late afternoon with Humalog  compared to Lyumjev  Time in range is slightly better  Occasionally with his mealtime boluses he may not get enough such as last night with eating pizza he did not add extra insulin and only covers carbohydrates  A couple of times he has had excessive boluses at meals causing low sugars  He is working day shifts and is generally active but blood sugars tend to be the highest late at night possibly from the type of meal or snacks when he is less active Unclear why his A1c is higher even though recent GMI is 6.9  CGM use % of time   2-week average/GV 151/30  Time in range      70%  % Time Above 180 26  % Time above 250 1  % Time Below 70 3     PRE-MEAL Fasting Lunch Dinner Bedtime Overall  Glucose range:       Averages: 161 150 140 210/191    POST-MEAL PC Breakfast PC Lunch PC Dinner  Glucose range:     Averages: 143 136 155   PREVIOUSLY  AVERAGES pre-meal/postmeal: Breakfast 145/100 Lunch 172/191 Dinner 171/159   CGM use % of time 92  2-week average/GV 173+/-61  Time in range      59%  % Time Above 180 29+11  % Time above 250   % Time Below 70 1     PRE-MEAL Fasting Lunch Dinner Bedtime Overall  Glucose range:       Averages: 160 188 197     POST-MEAL PC Breakfast PC Lunch PC Dinner  Glucose range:     Averages: 149 161 181      Lab Results  Component Value Date   HGBA1C 8.4 (A) 08/04/2021   HGBA1C 7.3 (A) 03/22/2021   HGBA1C 7.5 (A) 10/14/2020   Lab Results  Component Value Date   MICROALBUR <0.7 03/22/2021   LDLCALC 64 10/14/2020   CREATININE 1.04 03/22/2021       Allergies as of 08/04/2021   No Known Allergies      Medication List        Accurate as of August 04, 2021  9:27 AM. If you have any questions, ask your nurse or doctor.          atorvastatin 10 MG tablet Commonly known as: LIPITOR TAKE 1 TABLET BY MOUTH EVERY DAY   glucose blood test strip Commonly known as: Banker Next Test Use as instructed to check blood sugar  7-8 times per day dx code E10.65   insulin lispro 100 UNIT/ML injection Commonly known as: HumaLOG Use up to 60 units daily in insulin pump   lamoTRIgine 100 MG tablet Commonly known as: LAMICTAL Take 400 mg by mouth daily.   lamoTRIgine 200 MG tablet Commonly known as: LAMICTAL Take 1 tablet (200 mg total) by mouth 2 (two) times daily.        Allergies: No Known Allergies  Past Medical History:  Diagnosis Date   Seizures (HCC) since 2008    No past surgical history on file.  Family History  Problem Relation Age of Onset   Diabetes Neg Hx    Heart disease Neg Hx     Social History:  reports that he has never smoked. He has never used smokeless tobacco. No history on file for alcohol use and drug use.  REVIEW of systems:    Has been on Lipitor 10 mg consistently for hypercholesterolemia with good control of LDL  Lab Results  Component Value Date   CHOL 136 10/14/2020   HDL 64.70 10/14/2020   LDLCALC 64 10/14/2020   TRIG 41.0 10/14/2020   CHOLHDL 2 10/14/2020    No recent issues with hyponatremia  Lab Results  Component Value Date   CREATININE 1.04 03/22/2021   BUN 17 03/22/2021   NA 136 03/22/2021   K 4.1 03/22/2021   CL 101 03/22/2021   CO2 29 03/22/2021   No history of hypertension   BP Readings from Last 3 Encounters:  08/04/21 138/80  03/22/21 122/82  10/14/20 122/85   Has mild loss of weight recently possibly from slightly different diet in the summer  Wt Readings from Last 3 Encounters:  08/04/21 183 lb 3.2 oz (83.1 kg)  03/22/21 188 lb 9.6 oz (85.5 kg)  10/14/20 190 lb 6.4 oz (86.4 kg)    He apparently has been told to take lamotrigine long-term by his neurologist but has not been able to establish with a PCP as yet  EXAM:  BP 138/80   Pulse 63   Ht 5\' 8"  (1.727 m)   Wt 183 lb 3.2 oz (83.1 kg)   SpO2 95%   BMI 27.86 kg/m  ASSESSMENT:  See history of present illness for detailed analysis of blood sugars, continuous  glucose monitoring interpretation and discussion of day-to-day management and problems identified  He is using [employer] 770 pump  A1c is slightly higher at 8.4  He recently has a GMI was 6.7 and not clear why his A1c went up  Recent time in range is 70% and slightly better than before  Discussed day-to-day management of boluses and review of his CGM Discussed episodes of hyperglycemia and hypoglycemia that he is at as above  Recommendations: He will continue the same settings for his boluses but increase the basal rate at midnight to 1.0, 2 AM = 1 point 1 and 5 AM = 1.3.  This will cover his hypoglycemic episode when he is not in the auto mode  Need to bolus 10 to 15-minute before eating  Continue Humalog Needs to add at least 20 to 30 g extra carbohydrate for eating relatively higher fat meals such as pizza Continue same carbohydrate coverage and correction factor as well as active insulin time Discussed that with the 780 pump he will have an active insulin time of 2 hours Prescription for the 780 sensors was faxed today and he will call the nurse educator for start up  Follow-up chemistry and lipids on the next visit  Follow-up in 3 months   Dimitra Woodstock 08/04/2021, 9:27 AM

## 2021-08-11 ENCOUNTER — Encounter: Payer: Self-pay | Admitting: Endocrinology

## 2021-12-01 ENCOUNTER — Other Ambulatory Visit: Payer: Self-pay | Admitting: Endocrinology

## 2021-12-08 ENCOUNTER — Ambulatory Visit: Payer: BC Managed Care – PPO | Admitting: Endocrinology

## 2022-02-27 ENCOUNTER — Telehealth: Payer: Self-pay

## 2022-02-27 NOTE — Telephone Encounter (Signed)
Pt needs paperwork signed and faxed to [employer]. Pt is running low on supplies and needs it completed ASAP.

## 2022-02-28 ENCOUNTER — Encounter: Payer: Self-pay | Admitting: Endocrinology

## 2022-02-28 NOTE — Telephone Encounter (Signed)
Checked received faxes as well as faxed documents. Paperwork has not yet been received from [employer] regarding the pts supplies. Mychart encounter sent with pt to verify with [employer] that they've sent to correct fax number. Will keep a look out for pts paperwork.

## 2022-02-28 NOTE — Telephone Encounter (Signed)
Paperwork faxed back to [employer] and placed on [employer].

## 2022-03-30 ENCOUNTER — Encounter: Payer: Self-pay | Admitting: Endocrinology

## 2022-03-30 ENCOUNTER — Ambulatory Visit: Payer: BC Managed Care – PPO | Admitting: Endocrinology

## 2022-03-30 VITALS — BP 108/62 | HR 64 | Ht 68.0 in | Wt 189.0 lb

## 2022-03-30 DIAGNOSIS — E785 Hyperlipidemia, unspecified: Secondary | ICD-10-CM

## 2022-03-30 DIAGNOSIS — E1065 Type 1 diabetes mellitus with hyperglycemia: Secondary | ICD-10-CM | POA: Diagnosis not present

## 2022-03-30 LAB — COMPREHENSIVE METABOLIC PANEL
ALT: 31 U/L (ref 0–53)
AST: 19 U/L (ref 0–37)
Albumin: 4.4 g/dL (ref 3.5–5.2)
Alkaline Phosphatase: 92 U/L (ref 39–117)
BUN: 22 mg/dL (ref 6–23)
CO2: 30 mEq/L (ref 19–32)
Calcium: 9.8 mg/dL (ref 8.4–10.5)
Chloride: 99 mEq/L (ref 96–112)
Creatinine, Ser: 1.19 mg/dL (ref 0.40–1.50)
GFR: 73.86 mL/min (ref >60.00)
Glucose, Bld: 165 mg/dL — ABNORMAL HIGH (ref 70–99)
Potassium: 4.4 mEq/L (ref 3.5–5.1)
Sodium: 134 mEq/L — ABNORMAL LOW (ref 135–145)
Total Bilirubin: 1.1 mg/dL (ref 0.2–1.2)
Total Protein: 7.1 g/dL (ref 6.0–8.3)

## 2022-03-30 LAB — LIPID PANEL
Cholesterol: 204 mg/dL — ABNORMAL HIGH (ref 0–200)
HDL: 67.2 mg/dL (ref >39.00)
LDL Cholesterol: 125 mg/dL — ABNORMAL HIGH (ref 0–99)
NonHDL: 136.56
Total CHOL/HDL Ratio: 3
Triglycerides: 58 mg/dL (ref 0.0–149.0)
VLDL: 11.6 mg/dL (ref 0.0–40.0)

## 2022-03-30 LAB — POCT GLYCOSYLATED HEMOGLOBIN (HGB A1C): Hemoglobin A1C: 7 % — AB (ref 4.0–5.6)

## 2022-03-30 LAB — MICROALBUMIN / CREATININE URINE RATIO
Creatinine,U: 70.6 mg/dL
Microalb Creat Ratio: 1 mg/g (ref 0.0–30.0)
Microalb, Ur: <0.7 mg/dL (ref 0.0–1.9)

## 2022-03-30 MED ORDER — ATORVASTATIN CALCIUM 10 MG PO TABS: 10.0000 mg | ORAL_TABLET | Freq: Every day | ORAL | 3 refills | Status: DC

## 2022-03-30 NOTE — Progress Notes (Signed)
Tommy Newman is a 47 y.o. male.              Reason for Appointment: Insulin Pump followup:    History of Present Illness   Diagnosis: Type 1 DIABETES MELITUS, date of diagnosis: 1991      DIABETES history: He has been on an insulin pump since 1997  CURRENT insulin pump:  Medtronic 780   Carb Ratio: 1: 15 except 1: 18 at suppertime, correction factor 1: 40 with target 90-125 Active insulin time 3.5 hours                                       A1c is 7 compared to 8.4                            GLUCOSE CONTROL with the pump is assessed today by pump  download  He is working day shifts   Current blood sugar patterns, problems identified and current management:  He has been in the smart guard mode 91 % of the time  Blood sugars patterns are reviewed from his continuous glucose monitoring records for the last 2 weeks on the pump download, interpreted as follows:  HYPERGLYCEMIA is seen most frequently around 8 AM but also around 11 AM and 9 PM  Highest blood sugars are after about 10 PM through 1-2 AM  OVERNIGHT blood sugars are relatively higher at bedtime averaging around 200 but only gradually come down by 7-8 AM Premeal blood sugars are mildly increased at lunch but more so before dinner Judging from bolus timing his blood sugars seem to rise after the meal at dinnertime before the boluses active Hypoglycemia has been infrequent and only occasionally occurring midday or sporadically overnight and late evening Occasionally hypoglycemia may be preceded by hyperglycemia POSTPRANDIAL blood sugars are on an average increasing mostly after dinner or only 27 mg compared to Premeal readings    Insulin and pump management:  He is able to get the 780 pump and has been on it for a few months His time in range has not improved However he has not shortened his active insulin time to 2 hours as requested and his blood sugars tend to be higher about 3 hours after his bolus Also  at least at suppertime his boluses appear to be relatively late causing a rise in blood sugar before the boluses active Previously had not benefited as much from Lyumjev which was likely not lasting long enough He sometimes does not have adequate boluses and he thinks this is despite entering his carbs fairly accurately, however may not be always accounting for any higher fat intake Currently his total carbohydrate intake is about 300 g/day which is similar to before Occasionally he has tried giving extra boluses to bring his blood sugar down with entering extra carbs and periodically this may overcorrect  Currently highest blood sugars are late at the evenings likely after dinner  He thinks the temperature target does not work for him and his blood sugars may rise excessively Despite his time in target being only 67% has a GMI of 7.1   CGM use % of time 91  2-week average/GV 159/35  Time in range     67   %  % Time Above 180 25  % Time above 250 6  % Time Below 70 2  PRE-MEAL Fasting Lunch Dinner Bedtime Overall  Glucose range:       Averages: 160 162 176 238/165    POST-MEAL PC Breakfast PC Lunch PC Dinner  Glucose range:     Averages: 127 146 202   Previously:  CGM use % of time   2-week average/GV 151/30  Time in range      70%  % Time Above 180 26  % Time above 250 1  % Time Below 70 3     PRE-MEAL Fasting Lunch Dinner Bedtime Overall  Glucose range:       Averages: 161 150 140 210/191    POST-MEAL PC Breakfast PC Lunch PC Dinner  Glucose range:     Averages: 143 136 155     Lab Results  Component Value Date   HGBA1C 7.0 (A) 03/30/2022   HGBA1C 8.4 (A) 08/04/2021   HGBA1C 7.3 (A) 03/22/2021   Lab Results  Component Value Date   MICROALBUR <0.7 03/22/2021   LDLCALC 64 10/14/2020   CREATININE 1.04 03/22/2021       Allergies as of 03/30/2022   No Known Allergies      Medication List        Accurate as of March 30, 2022  9:55 AM. If you have  any questions, ask your nurse or doctor.          atorvastatin 10 MG tablet Commonly known as: LIPITOR TAKE 1 TABLET BY MOUTH EVERY DAY   glucose blood test strip Commonly known as: Visual merchandiser Next Test Use as instructed to check blood sugar 7-8 times per day dx code E10.65   insulin lispro 100 UNIT/ML injection Commonly known as: HumaLOG INJECT SUBCUTANEOUSLY UP TO 60  UNITS DAILY VIA INSULIN PUMP   lamoTRIgine 100 MG tablet Commonly known as: LAMICTAL Take 400 mg by mouth daily.   lamoTRIgine 200 MG tablet Commonly known as: LAMICTAL Take 1 tablet (200 mg total) by mouth 2 (two) times daily.        Allergies: No Known Allergies  Past Medical History:  Diagnosis Date   Seizures ([place]) since 2008    No past surgical history on file.  Family History  Problem Relation Age of Onset   Diabetes Neg Hx    Heart disease Neg Hx     Social History:  reports that he has never smoked. He has never used smokeless tobacco. No history on file for alcohol use and drug use.  REVIEW of systems:    Has been on Lipitor 10 mg for hypercholesterolemia with good control of LDL  Lab Results  Component Value Date   CHOL 136 10/14/2020   HDL 64.70 10/14/2020   LDLCALC 64 10/14/2020   TRIG 41.0 10/14/2020   CHOLHDL 2 10/14/2020   Usually has normal microalbumin and renal function  Lab Results  Component Value Date   CREATININE 1.19 03/30/2022   CREATININE 1.04 03/22/2021   CREATININE 1.06 10/14/2020     No history of hypertension   BP Readings from Last 3 Encounters:  03/30/22 108/62  08/04/21 138/80  03/22/21 122/82   Weight history:  Wt Readings from Last 3 Encounters:  03/30/22 189 lb (85.7 kg)  08/04/21 183 lb 3.2 oz (83.1 kg)  03/22/21 188 lb 9.6 oz (85.5 kg)    He has been told to take lamotrigine long-term by his neurologist but has not been able to establish with a PCP again  EXAM:  BP 108/62   Pulse 64   Ht  5\' 8"  (1.727 m)   Wt 189 lb (85.7  kg)   SpO2 97%   BMI 28.74 kg/m       ASSESSMENT:  See history of present illness for detailed analysis of blood sugars, continuous glucose monitoring interpretation and discussion of day-to-day management and problems identified  He is using Medtronic 780 pump  A1c is much better at 7%  Recent time in range is slightly less at 67% compared to last visit  Reviewed his day-to-day management of glucose patterns, boluses and periods of hyperglycemia and hypoglycemia  His blood sugar control may improve with shortening the active insulin time to 2 hours which is recommended for the 780 pump Also at least at suppertime need to bolus a few minutes ahead of time With any higher fat meals need to bolus extra 2 to 3 units Also may consider increasing carb ratio to 1: 15 at dinner also if getting more consistently high readings Or his manual basal rate he will use a basal setting of 1.3 from 8 PM onwards Will have his bolus history and blood sugar patterns reviewed by the diabetes educator from [employer] also  Follow-up chemistry and lipids as well as microalbumin today  Follow-up in 4 months   Renley Gutman 03/30/2022, 9:55 AM   Addendum: Has not been likely taking Lipitor as his LDL is 125, microalbumin and renal function normal Lipitor sent in  Elayne Snare

## 2022-04-14 ENCOUNTER — Encounter: Payer: Self-pay | Admitting: Endocrinology

## 2022-07-25 ENCOUNTER — Encounter: Payer: Self-pay | Admitting: Endocrinology

## 2022-07-27 NOTE — Telephone Encounter (Signed)
I called and spoke with the patient, he is not currently on a dexcom. He is wanting to switch from Medtronic to OmniPod and could one of you please assist and speak with this patient?

## 2022-08-01 NOTE — Telephone Encounter (Signed)
Please order him the Dexcom G6 sensors and transmitter at his pharmacy

## 2022-08-02 ENCOUNTER — Other Ambulatory Visit: Payer: Self-pay | Admitting: Endocrinology

## 2022-08-02 DIAGNOSIS — E1065 Type 1 diabetes mellitus with hyperglycemia: Secondary | ICD-10-CM

## 2022-08-02 MED ORDER — OMNIPOD 5 DEXG7G6 PODS GEN 5 MISC: 1.0000 | 3 refills | Status: DC

## 2022-08-02 MED ORDER — DEXCOM G6 SENSOR MISC: 3 refills | Status: AC

## 2022-08-02 MED ORDER — DEXCOM G6 TRANSMITTER MISC: 1.0000 | 3 refills | Status: AC

## 2022-08-02 MED ORDER — DEXCOM G6 TRANSMITTER MISC: 1.0000 | Freq: Once | 1 refills | Status: AC

## 2022-08-02 MED ORDER — OMNIPOD 5 DEXG7G6 INTRO GEN 5 KIT: 1.0000 | PACK | Freq: Once | 0 refills | Status: DC

## 2022-08-06 ENCOUNTER — Encounter: Payer: Self-pay | Admitting: Endocrinology

## 2022-08-07 ENCOUNTER — Telehealth: Payer: Self-pay | Admitting: Dietician

## 2022-08-07 NOTE — Telephone Encounter (Signed)
Called patient. He would like training on the Omnipod 5 insulin pump. He has the pump but no PODS at this time. Discussed that he needs to have the pods and pump before this appointment can be made. He is seeing his endo on 08/14/2022.  Instructed him to call as soon as he has his supplies.  [phone]  Oran Rein, RD, LDN, CDCES

## 2022-08-14 ENCOUNTER — Encounter: Payer: Self-pay | Admitting: Endocrinology

## 2022-08-17 ENCOUNTER — Encounter: Payer: Self-pay | Admitting: Endocrinology

## 2022-08-17 ENCOUNTER — Ambulatory Visit: Payer: BC Managed Care – PPO | Admitting: Endocrinology

## 2022-08-17 VITALS — BP 126/74 | HR 78 | Ht 68.0 in | Wt 188.4 lb

## 2022-08-17 DIAGNOSIS — E785 Hyperlipidemia, unspecified: Secondary | ICD-10-CM

## 2022-08-17 DIAGNOSIS — E1065 Type 1 diabetes mellitus with hyperglycemia: Secondary | ICD-10-CM | POA: Diagnosis not present

## 2022-08-17 LAB — LIPID PANEL
Cholesterol: 158 mg/dL (ref 0–200)
HDL: 68.1 mg/dL (ref >39.00)
LDL Cholesterol: 80 mg/dL (ref 0–99)
NonHDL: 89.54
Total CHOL/HDL Ratio: 2
Triglycerides: 48 mg/dL (ref 0.0–149.0)
VLDL: 9.6 mg/dL (ref 0.0–40.0)

## 2022-08-17 LAB — BASIC METABOLIC PANEL
BUN: 15 mg/dL (ref 6–23)
CO2: 30 mEq/L (ref 19–32)
Calcium: 9.6 mg/dL (ref 8.4–10.5)
Chloride: 99 mEq/L (ref 96–112)
Creatinine, Ser: 1.12 mg/dL (ref 0.40–1.50)
GFR: 79.22 mL/min (ref >60.00)
Glucose, Bld: 121 mg/dL — ABNORMAL HIGH (ref 70–99)
Potassium: 4.4 mEq/L (ref 3.5–5.1)
Sodium: 133 mEq/L — ABNORMAL LOW (ref 135–145)

## 2022-08-17 LAB — POCT GLYCOSYLATED HEMOGLOBIN (HGB A1C): Hemoglobin A1C: 7 % — AB (ref 4.0–5.6)

## 2022-08-17 NOTE — Progress Notes (Signed)
Tommy Newman is a 47 y.o. male.              Reason for Appointment: Insulin Pump followup:    History of Present Illness   Diagnosis: Type 1 DIABETES MELITUS, date of diagnosis: 1991      DIABETES history: He has been on an insulin pump since 1997  CURRENT insulin pump:  Medtronic 780   Carb Ratio: 1: 15 except 1: 18 at suppertime, correction factor 1: 40 with target 90-125 Active insulin time 3.5 hours                                       A1c is 7 compared to 8.4                            GLUCOSE CONTROL with the pump is assessed today by pump  download  He is working day shifts   Current blood sugar patterns, problems identified and current management:  He has been in the smart guard mode 89 % of the time  Blood sugars patterns are reviewed from his continuous glucose monitoring followed up with Guardian sensor for the last 2 weeks    HYPERGLYCEMIA is occurring mostly late in the evening after 10 PM but occasionally at midday and 5-6 p.m.; blood sugars show sharp swings up-and-down with hypoglycemia occurring to a variable extent multiple times each day  OVERNIGHT blood sugars are averaging around 180 at midnight and variable from day-to-day with occasional excellent readings in the low 100 range and no hypoglycemia  Premeal blood sugars are variable and generally averaging about 1 60-1 80 at dinnertime Hypoglycemia is seen sporadically mostly in the afternoon or early evening at different times and generally preceded by some hyperglycemia; occasionally blood sugars may be low in the afternoon or early evening even without preceding bolus POSTPRANDIAL blood sugars are on an average not sharply higher but variable with some episodes of significant hyperglycemia or hypoglycemia also Mostly after dinner or only 27 mg compared to Premeal readings Overall time in range is the same as the last visit   Insulin and pump management:   He is wanting to switch to the  OmniPod pump because of difficulties getting his supplies consistently as well as the sensor not being consistently accurate or lasting the full 7 days; also would like to try and moved to the tubeless pump As above his sensor readings show highly fluctuating blood sugars indicating the sensor may not be consistently accurate accurate and occasionally having low sugar readings without true hypoglycemia He does try to bolus before the meal but most of the time this is just before eating unless eating higher fat meals such as pizza Response to boluses are variable with occasionally low readings post bolus and occasionally significantly higher He says that during the temporary target will make his blood sugar go high but 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  CGM use % of time   2-week average/GV 156/33  Time in range        % 67  % Time Above 180 25  % Time above 250 4  % Time Below 70 4     PRE-MEAL Fasting Lunch Dinner Bedtime Overall  Glucose range:       Averages: 152 138 201 238/164    POST-MEAL PC Breakfast  PC Lunch PC Dinner  Glucose range:     Averages: 129 155 219   PREVIOUSLY:  CGM use % of time 91  2-week average/GV 159/35  Time in range     67   %  % Time Above 180 25  % Time above 250 6  % Time Below 70 2     PRE-MEAL Fasting Lunch Dinner Bedtime Overall  Glucose range:       Averages: 160 162 176 238/165    POST-MEAL PC Breakfast PC Lunch PC Dinner  Glucose range:     Averages: 127 146 202      Lab Results  Component Value Date   HGBA1C 7.0 (A) 08/17/2022   HGBA1C 7.0 (A) 03/30/2022   HGBA1C 8.4 (A) 08/04/2021   Lab Results  Component Value Date   MICROALBUR <0.7 03/30/2022   LDLCALC 125 (H) 03/30/2022   CREATININE 1.19 03/30/2022       Allergies as of 08/17/2022   No Known Allergies      Medication List        Accurate as of August 17, 2022 11:02 AM. If you have any questions, ask your nurse or doctor.           atorvastatin 10 MG tablet Commonly known as: LIPITOR Take 1 tablet (10 mg total) by mouth daily.   Dexcom G6 Sensor Misc Use to monitor blood sugar, change after 10 days   Dexcom G6 Transmitter Misc 1 Device by Does not apply route continuous.   glucose blood test strip Commonly known as: Banker Next Test Use as instructed to check blood sugar 7-8 times per day dx code E10.65   insulin lispro 100 UNIT/ML injection Commonly known as: HumaLOG INJECT SUBCUTANEOUSLY UP TO 60  UNITS DAILY VIA INSULIN PUMP   lamoTRIgine 200 MG tablet Commonly known as: LAMICTAL Take 1 tablet (200 mg total) by mouth 2 (two) times daily.        Allergies: No Known Allergies  Past Medical History:  Diagnosis Date   Seizures ([facility]) since 2008    History reviewed. No pertinent surgical history.  Family History  Problem Relation Age of Onset   Diabetes Neg Hx    Heart disease Neg Hx     Social History:  reports that he has never smoked. He has never used smokeless tobacco. No history on file for alcohol use and drug use.  REVIEW of systems:    Has been on Lipitor 10 mg for hypercholesterolemia with good control of LDL He had some difficulty refilling his prescriptions and now is taking regularly for the last month  Lab Results  Component Value Date   CHOL 204 (H) 03/30/2022   HDL 67.20 03/30/2022   LDLCALC 125 (H) 03/30/2022   TRIG 58.0 03/30/2022   CHOLHDL 3 03/30/2022    Recently has normal microalbumin and renal function  Lab Results  Component Value Date   CREATININE 1.19 03/30/2022   CREATININE 1.04 03/22/2021   CREATININE 1.06 10/14/2020    No history of hypertension   BP Readings from Last 3 Encounters:  08/17/22 126/74  03/30/22 108/62  08/04/21 138/80   Weight history:  Wt Readings from Last 3 Encounters:  08/17/22 188 lb 6.4 oz (85.5 kg)  03/30/22 189 lb (85.7 kg)  08/04/21 183 lb 3.2 oz (83.1 kg)    He has been told to take lamotrigine  long-term by his neurologist   EXAM:  BP 126/74   Pulse 78  Ht 5\' 8"  (1.727 m)   Wt 188 lb 6.4 oz (85.5 kg)   SpO2 95%   BMI 28.65 kg/m       ASSESSMENT:  See history of present illness for detailed analysis of blood sugars, continuous glucose monitoring interpretation and discussion of day-to-day management and problems identified  He is using Medtronic 780 pump  A1c is unchanged at 7%  Recent time in range is unchanged at 67% compared to last visit As above his blood sugars are somewhat variable including postprandial responses Also he has occasional unexpected low sugars possibly from overestimating his bolus amount However also has occasional hyperglycemia after meals even though he is trying to bolus before eating He may occasionally have low sugars related to physical activity and not using the temporary target  His CGM pattern was reviewed and causes of high and low sugars identified  HYPERCHOLESTEROLEMIA: Previously LDL was high with not taking Lipitor regularly  Recommendations:  Correction factor I: 50 instead of 40 to avoid potential low sugars both manual and auto correction Try to bolus few minutes before starting to eat at least when eating at home Continue Humalog as he did not have improvement in his control with Lyumjev Follow-up chemistry and lipids today Schedule for switching to the OmniPod pump on Monday with training  Follow-up 2 months after starting the pump   Jaylynn Siefert 08/17/2022, 11:02 AM

## 2022-08-17 NOTE — Patient Instructions (Signed)
Sensitivity 50

## 2022-08-18 ENCOUNTER — Encounter: Payer: Self-pay | Admitting: Internal Medicine

## 2022-08-18 ENCOUNTER — Encounter: Payer: Self-pay | Admitting: Endocrinology

## 2022-08-21 ENCOUNTER — Encounter: Payer: BC Managed Care – PPO | Attending: Endocrinology | Admitting: Nutrition

## 2022-08-21 NOTE — Patient Instructions (Addendum)
Call Dexcom help line if questions about your dexcom use.  Call OmniPod help line if questions about your OmniPod REad over OmniPod starter booklet given, and read manual on line. Call office if blood sugars are dropping low, or remain over 225. Change sensor on your phone, Change transmitter number on your phone and your PDM.

## 2022-08-21 NOTE — Progress Notes (Unsigned)
Patient was trained on the use of the OmniPod5 insulin pump.  Settings were transferred from his medtronic pump:  Basal rate:  MN: 0.80, 5AM: 1.0, 9AM: 1.1, 1:30PM: 1.15,  ISF: 40,  I/C: MN: 15, 4PM: 18,  timing 2 hours,  Target 110 with correction over 120.  He was shown how to bolus, and when and how to do correction doses.   He was also trained on the use of the Dexcom G6 sensors.  His PDM can use the G7 sensors.  He started a sensor in his right arm and linked this to his phone and to Clarity.  Clarity app was linked to [place] [facility].  His PDM was also linked to [facility]:  user name: [email or web address]  PW: [email or web address]!Marland Kitchen  He filled a pod and placed this on his left leg and started this at 10:30AM.  The sensor will not work for 2 hours.  We reviewed alerts/alarm and covered all topics oh the check list.  He signed the checklist as understanding all topics and had no final questions. Help line numbers give for Dexcom, and OmniPod

## 2022-08-22 ENCOUNTER — Telehealth: Payer: Self-pay | Admitting: Nutrition

## 2022-08-22 NOTE — Telephone Encounter (Signed)
Patient reports no difficulty with using the pump, and giving the bouses.  Says blood sugar was high after supper-in the 200s for several hours, but eventually to 110 this morning. I reminded him of the need to do correction doses and discussed again how to do this.  He reported good understanding of this and had no further questions for me at this time.

## 2022-09-18 ENCOUNTER — Encounter: Payer: Self-pay | Admitting: Endocrinology

## 2022-09-25 LAB — EXTERNAL GENERIC LAB PROCEDURE: COLOGUARD: POSITIVE — AB

## 2022-09-28 ENCOUNTER — Encounter (INDEPENDENT_AMBULATORY_CARE_PROVIDER_SITE_OTHER): Payer: Self-pay | Admitting: *Deleted

## 2022-10-06 ENCOUNTER — Telehealth: Payer: Self-pay | Admitting: Dietician

## 2022-10-06 NOTE — Telephone Encounter (Signed)
Called patient re:  Omnipod portal insulin pump training. He has been trained on his Omnipod 5 insulin pump on 08/07/2022 by a collogue.  Will remove from my training portal.  Oran Rein, RD, LDN, CDCES

## 2022-10-12 ENCOUNTER — Encounter (INDEPENDENT_AMBULATORY_CARE_PROVIDER_SITE_OTHER): Payer: Self-pay | Admitting: Gastroenterology

## 2022-10-12 ENCOUNTER — Ambulatory Visit (INDEPENDENT_AMBULATORY_CARE_PROVIDER_SITE_OTHER): Payer: BC Managed Care – PPO | Admitting: Gastroenterology

## 2022-10-12 VITALS — BP 132/64 | HR 67 | Temp 97.5°F | Ht 68.0 in | Wt 191.8 lb

## 2022-10-12 DIAGNOSIS — K529 Noninfective gastroenteritis and colitis, unspecified: Secondary | ICD-10-CM | POA: Insufficient documentation

## 2022-10-12 DIAGNOSIS — R195 Other fecal abnormalities: Secondary | ICD-10-CM | POA: Insufficient documentation

## 2022-10-12 NOTE — H&P (View-Only) (Signed)
[name], M.D. Gastroenterology & Hepatology [facility] [address] [place], [place] [address] Primary Care Physician: Vanessa [place], FNP [address] [place] [address]  Referring MD: PCP  Chief Complaint: Positive Cologuard  History of Present Illness: Joangel Vanosdol is a 47 y.o. male with PMH seizures, DM , anxiety, who presents for evaluation of positive Cologuard and diarrhea.  Patient had positive Cologuard on 09/18/2022.  Patient reports that for the last few months he has presented 3-4 watery bowel movements every day, which have a watery consistency. Sometimes he has some urgency. Has not started started any medication recently and has not changed his diet at all.  He does not take any antidiarrheals.  No nighttime symptoms.  The patient denies having any nausea, vomiting, fever, chills, hematochezia, melena, hematemesis, abdominal distention, abdominal pain,  jaundice, pruritus or weight loss.  Last [id number] Last Colonoscopy:never  FHx: Crohn's disease mother, neg for any gastrointestinal/liver disease, no malignancies Social: neg smoking, alcohol or illicit drug use Surgical: appendectomy  Past Medical History: Past Medical History:  Diagnosis Date   Seizures ([facility]) since 2008    Past Surgical History:History reviewed. No pertinent surgical history.  Family History: Family History  Problem Relation Age of Onset   Diabetes Neg Hx    Heart disease Neg Hx     Social History: Social History   Tobacco Use  Smoking Status Never  Smokeless Tobacco Never   Social History   Substance and Sexual Activity  Alcohol Use Yes   Comment: rarely   Social History   Substance and Sexual Activity  Drug Use Not on file    Allergies: No Known Allergies  Medications: Current Outpatient Medications  Medication Sig Dispense Refill   atorvastatin (LIPITOR) 10 MG tablet Take 1 tablet (10 mg total)  by mouth daily. 90 tablet 3   citalopram (CELEXA) 20 MG tablet Take 20 mg by mouth daily.     Continuous Glucose Sensor (DEXCOM G6 SENSOR) MISC Use to monitor blood sugar, change after 10 days 3 each 3   Continuous Glucose Transmitter (DEXCOM G6 TRANSMITTER) MISC 1 Device by Does not apply route continuous. 1 each 3   glucose blood (BAYER CONTOUR NEXT TEST) test strip Use as instructed to check blood sugar 7-8 times per day dx code E10.65 725 each 1   Insulin Disposable Pump (OMNIPOD 5 G6 INTRO, GEN 5,) KIT SMARTSIG:SUB-Q Every 3 Days     insulin lispro (HUMALOG) 100 UNIT/ML injection INJECT SUBCUTANEOUSLY UP TO 60  UNITS DAILY VIA INSULIN PUMP 60 mL 3   lamoTRIgine (LAMICTAL) 200 MG tablet Take 1 tablet (200 mg total) by mouth 2 (two) times daily. (Patient taking differently: Take 400 mg by mouth daily.) 60 tablet 0   zolpidem (AMBIEN) 5 MG tablet Take 5 mg by mouth at bedtime.     No current facility-administered medications for this visit.    Review of Systems: GENERAL: negative for malaise, night sweats HEENT: No changes in hearing or vision, no nose bleeds or other nasal problems. NECK: Negative for lumps, goiter, pain and significant neck swelling RESPIRATORY: Negative for cough, wheezing CARDIOVASCULAR: Negative for chest pain, leg swelling, palpitations, orthopnea GI: SEE HPI MUSCULOSKELETAL: Negative for joint pain or swelling, back pain, and muscle pain. SKIN: Negative for lesions, rash PSYCH: Negative for sleep disturbance, mood disorder and recent psychosocial stressors. HEMATOLOGY Negative for prolonged bleeding, bruising easily, and swollen nodes. ENDOCRINE: Negative for cold or heat intolerance, polyuria, polydipsia and  goiter. NEURO: negative for tremor, gait imbalance, syncope and seizures. The remainder of the review of systems is noncontributory.   Physical Exam: BP 132/64 (BP Location: Left Arm, Patient Position: Sitting, Cuff Size: Large)   Pulse 67   Temp (!)  97.5 F (36.4 C) (Temporal)   Ht 5\' 8"  (1.727 m)   Wt 191 lb 12.8 oz (87 kg)   BMI 29.16 kg/m  GENERAL: The patient is AO x3, in no acute distress. HEENT: Head is normocephalic and atraumatic. EOMI are intact. Mouth is well hydrated and without lesions. NECK: Supple. No masses LUNGS: Clear to auscultation. No presence of rhonchi/wheezing/rales. Adequate chest expansion HEART: RRR, normal s1 and s2. ABDOMEN: Soft, nontender, no guarding, no peritoneal signs, and nondistended. BS +. No masses. EXTREMITIES: Without any cyanosis, clubbing, rash, lesions or edema. NEUROLOGIC: AOx3, no focal motor deficit. SKIN: no jaundice, no rashes   Imaging/Labs: as above  I personally reviewed and interpreted the available labs, imaging and endoscopic files.  Impression and Plan: [name] is a 47 y.o. male with PMH seizures, DM , anxiety, who presents for evaluation of positive Cologuard and diarrhea.  The patient has presented some recent onset of watery bowel movements.  He has not presented any red flag signs but has noticed a difference in the consistency of his stools.  We will check for diseases such as celiac disease, hypothyroidism, as well as for inflammatory markers in serology.  He was advised to increase his intake of fiber as this may increase the bulk of stool.  If by the time he is seen for colonoscopy he has persistent diarrhea, we will obtain random colonic biopsies.  If persisting diarrhea with negative workup, will consider workup for EPI given his history of type 1 diabetes.  Patient does not have any high risk factors for colorectal cancer malignancy and has been relatively asymptomatic. Discussed cologuard test results in detail, specifically what it means when the test is positive or negative.  Discussed that there is a possibility that even when the test is positive there may not be a polyp found on colonoscopy. More than 50% of the office visit was dedicated to discussing the  procedure, including the day of and risks involved. Patient understands what the procedure involves including the benefits and any risks. Patient understands alternatives to the proposed procedure. Risks including (but not limited to) bleeding, tearing of the lining (perforation), rupture of adjacent organs, problems with heart and lung function, infection, and medication reactions. A small percentage of complications may require surgery, hospitalization, repeat endoscopic procedure, and/or transfusion. A small percentage of polyps and other tumors may not be seen.  - Schedule colonoscopy -Check celiac disease panel, CRP and TSH -Start Benefiber fiber supplements daily to increase stool bulk  All questions were answered.      [name], MD Gastroenterology and Hepatology [facility]

## 2022-10-12 NOTE — Progress Notes (Signed)
Tommy Newman, M.D. Gastroenterology & Hepatology [facility] [address] [place], [place] [address] Primary Care Physician: Vanessa Northvale, FNP [address] [place] [address]  Referring MD: PCP  Chief Complaint: Positive Cologuard  History of Present Illness: Tommy Newman is a 47 y.o. male with PMH seizures, DM , anxiety, who presents for evaluation of positive Cologuard and diarrhea.  Patient had positive Cologuard on 09/18/2022.  Patient reports that for the last few months he has presented 3-4 watery bowel movements every day, which have a watery consistency. Sometimes he has some urgency. Has not started started any medication recently and has not changed his diet at all.  He does not take any antidiarrheals.  No nighttime symptoms.  The patient denies having any nausea, vomiting, fever, chills, hematochezia, melena, hematemesis, abdominal distention, abdominal pain,  jaundice, pruritus or weight loss.  Last UJW:JXBJY Last Colonoscopy:never  FHx: Crohn's disease mother, neg for any gastrointestinal/liver disease, no malignancies Social: neg smoking, alcohol or illicit drug use Surgical: appendectomy  Past Medical History: Past Medical History:  Diagnosis Date   Seizures (HCC) since 2008    Past Surgical History:History reviewed. No pertinent surgical history.  Family History: Family History  Problem Relation Age of Onset   Diabetes Neg Hx    Heart disease Neg Hx     Social History: Social History   Tobacco Use  Smoking Status Never  Smokeless Tobacco Never   Social History   Substance and Sexual Activity  Alcohol Use Yes   Comment: rarely   Social History   Substance and Sexual Activity  Drug Use Not on file    Allergies: No Known Allergies  Medications: Current Outpatient Medications  Medication Sig Dispense Refill   atorvastatin (LIPITOR) 10 MG tablet Take 1 tablet (10 mg total)  by mouth daily. 90 tablet 3   citalopram (CELEXA) 20 MG tablet Take 20 mg by mouth daily.     Continuous Glucose Sensor (DEXCOM G6 SENSOR) MISC Use to monitor blood sugar, change after 10 days 3 each 3   Continuous Glucose Transmitter (DEXCOM G6 TRANSMITTER) MISC 1 Device by Does not apply route continuous. 1 each 3   glucose blood (BAYER CONTOUR NEXT TEST) test strip Use as instructed to check blood sugar 7-8 times per day dx code E10.65 725 each 1   Insulin Disposable Pump (OMNIPOD 5 G6 INTRO, GEN 5,) KIT SMARTSIG:SUB-Q Every 3 Days     insulin lispro (HUMALOG) 100 UNIT/ML injection INJECT SUBCUTANEOUSLY UP TO 60  UNITS DAILY VIA INSULIN PUMP 60 mL 3   lamoTRIgine (LAMICTAL) 200 MG tablet Take 1 tablet (200 mg total) by mouth 2 (two) times daily. (Patient taking differently: Take 400 mg by mouth daily.) 60 tablet 0   zolpidem (AMBIEN) 5 MG tablet Take 5 mg by mouth at bedtime.     No current facility-administered medications for this visit.    Review of Systems: GENERAL: negative for malaise, night sweats HEENT: No changes in hearing or vision, no nose bleeds or other nasal problems. NECK: Negative for lumps, goiter, pain and significant neck swelling RESPIRATORY: Negative for cough, wheezing CARDIOVASCULAR: Negative for chest pain, leg swelling, palpitations, orthopnea GI: SEE HPI MUSCULOSKELETAL: Negative for joint pain or swelling, back pain, and muscle pain. SKIN: Negative for lesions, rash PSYCH: Negative for sleep disturbance, mood disorder and recent psychosocial stressors. HEMATOLOGY Negative for prolonged bleeding, bruising easily, and swollen nodes. ENDOCRINE: Negative for cold or heat intolerance, polyuria, polydipsia and  goiter. NEURO: negative for tremor, gait imbalance, syncope and seizures. The remainder of the review of systems is noncontributory.   Physical Exam: BP 132/64 (BP Location: Left Arm, Patient Position: Sitting, Cuff Size: Large)   Pulse 67   Temp (!)  97.5 F (36.4 C) (Temporal)   Ht 5\' 8"  (1.727 m)   Wt 191 lb 12.8 oz (87 kg)   BMI 29.16 kg/m  GENERAL: The patient is AO x3, in no acute distress. HEENT: Head is normocephalic and atraumatic. EOMI are intact. Mouth is well hydrated and without lesions. NECK: Supple. No masses LUNGS: Clear to auscultation. No presence of rhonchi/wheezing/rales. Adequate chest expansion HEART: RRR, normal s1 and s2. ABDOMEN: Soft, nontender, no guarding, no peritoneal signs, and nondistended. BS +. No masses. EXTREMITIES: Without any cyanosis, clubbing, rash, lesions or edema. NEUROLOGIC: AOx3, no focal motor deficit. SKIN: no jaundice, no rashes   Imaging/Labs: as above  I personally reviewed and interpreted the available labs, imaging and endoscopic files.  Impression and Plan: [name] is a 47 y.o. male with PMH seizures, DM , anxiety, who presents for evaluation of positive Cologuard and diarrhea.  The patient has presented some recent onset of watery bowel movements.  He has not presented any red flag signs but has noticed a difference in the consistency of his stools.  We will check for diseases such as celiac disease, hypothyroidism, as well as for inflammatory markers in serology.  He was advised to increase his intake of fiber as this may increase the bulk of stool.  If by the time he is seen for colonoscopy he has persistent diarrhea, we will obtain random colonic biopsies.  If persisting diarrhea with negative workup, will consider workup for EPI given his history of type 1 diabetes.  Patient does not have any high risk factors for colorectal cancer malignancy and has been relatively asymptomatic. Discussed cologuard test results in detail, specifically what it means when the test is positive or negative.  Discussed that there is a possibility that even when the test is positive there may not be a polyp found on colonoscopy. More than 50% of the office visit was dedicated to discussing the  procedure, including the day of and risks involved. Patient understands what the procedure involves including the benefits and any risks. Patient understands alternatives to the proposed procedure. Risks including (but not limited to) bleeding, tearing of the lining (perforation), rupture of adjacent organs, problems with heart and lung function, infection, and medication reactions. A small percentage of complications may require surgery, hospitalization, repeat endoscopic procedure, and/or transfusion. A small percentage of polyps and other tumors may not be seen.  - Schedule colonoscopy -Check celiac disease panel, CRP and TSH -Start Benefiber fiber supplements daily to increase stool bulk  All questions were answered.      Tommy Blazing, MD Gastroenterology and Hepatology [facility]

## 2022-10-12 NOTE — Patient Instructions (Addendum)
Schedule colonoscopy Perform blood workup Start Benefiber fiber supplements daily to increase stool bulk

## 2022-10-13 LAB — CELIAC DISEASE PANEL
(tTG) Ab, IgA: <1 U/mL
(tTG) Ab, IgG: <1 U/mL
Gliadin IgA: <1 U/mL
Gliadin IgG: <1 U/mL
Immunoglobulin A: 108 mg/dL (ref 47–310)

## 2022-10-13 LAB — C-REACTIVE PROTEIN: CRP: <3 mg/L (ref <8.0)

## 2022-10-17 MED ORDER — PEG 3350-KCL-NA BICARB-NACL 420 G PO SOLR: 4000.0000 mL | Freq: Once | ORAL | 0 refills | Status: AC

## 2022-10-17 NOTE — Addendum Note (Signed)
Addended by: Marlowe Shores on: 10/17/2022 04:56 PM   Modules accepted: Orders

## 2022-10-18 ENCOUNTER — Encounter: Payer: Self-pay | Admitting: Endocrinology

## 2022-10-26 ENCOUNTER — Encounter: Payer: Self-pay | Admitting: Endocrinology

## 2022-10-26 ENCOUNTER — Telehealth: Payer: Self-pay

## 2022-10-26 NOTE — Telephone Encounter (Signed)
Patient sent message via MyChart needing Dexcom G7.   Samples of Dexcom G7 were given to the patient, quantity 2, Lot Number [id number] (x2), Exp date: 11/02/23 (x2) left at front desk for patient.

## 2022-11-03 ENCOUNTER — Other Ambulatory Visit: Payer: Self-pay

## 2022-11-03 ENCOUNTER — Encounter (HOSPITAL_COMMUNITY)
Admission: RE | Disposition: A | Discharge status: Home / Self Care | Payer: Self-pay | Source: Home / Self Care | Attending: Gastroenterology

## 2022-11-03 ENCOUNTER — Ambulatory Visit (HOSPITAL_COMMUNITY): Payer: BC Managed Care – PPO | Admitting: Certified Registered"

## 2022-11-03 ENCOUNTER — Encounter (HOSPITAL_COMMUNITY): Payer: Self-pay | Admitting: Gastroenterology

## 2022-11-03 ENCOUNTER — Ambulatory Visit (HOSPITAL_COMMUNITY)
Admission: RE | Admit: 2022-11-03 | Discharge: 2022-11-03 | Disposition: A | Discharge status: Home / Self Care | Payer: BC Managed Care – PPO | Attending: Gastroenterology | Admitting: Gastroenterology

## 2022-11-03 ENCOUNTER — Ambulatory Visit: Payer: BC Managed Care – PPO | Admitting: Endocrinology

## 2022-11-03 DIAGNOSIS — R195 Other fecal abnormalities: Secondary | ICD-10-CM | POA: Diagnosis not present

## 2022-11-03 DIAGNOSIS — D122 Benign neoplasm of ascending colon: Secondary | ICD-10-CM | POA: Diagnosis not present

## 2022-11-03 DIAGNOSIS — Z1211 Encounter for screening for malignant neoplasm of colon: Secondary | ICD-10-CM

## 2022-11-03 DIAGNOSIS — E109 Type 1 diabetes mellitus without complications: Secondary | ICD-10-CM | POA: Diagnosis not present

## 2022-11-03 DIAGNOSIS — R569 Unspecified convulsions: Secondary | ICD-10-CM | POA: Insufficient documentation

## 2022-11-03 DIAGNOSIS — K648 Other hemorrhoids: Secondary | ICD-10-CM | POA: Diagnosis not present

## 2022-11-03 DIAGNOSIS — Z794 Long term (current) use of insulin: Secondary | ICD-10-CM | POA: Diagnosis not present

## 2022-11-03 DIAGNOSIS — D120 Benign neoplasm of cecum: Secondary | ICD-10-CM | POA: Insufficient documentation

## 2022-11-03 DIAGNOSIS — R197 Diarrhea, unspecified: Secondary | ICD-10-CM | POA: Insufficient documentation

## 2022-11-03 DIAGNOSIS — G4730 Sleep apnea, unspecified: Secondary | ICD-10-CM | POA: Diagnosis not present

## 2022-11-03 HISTORY — DX: Sleep apnea, unspecified: G47.30

## 2022-11-03 HISTORY — DX: Type 2 diabetes mellitus without complications: E11.9

## 2022-11-03 HISTORY — PX: HEMOSTASIS CLIP PLACEMENT: SHX6857

## 2022-11-03 HISTORY — PX: POLYPECTOMY: SHX5525

## 2022-11-03 HISTORY — PX: COLONOSCOPY WITH PROPOFOL: SHX5780

## 2022-11-03 HISTORY — PX: BIOPSY: SHX5522

## 2022-11-03 HISTORY — PX: SUBMUCOSAL LIFTING INJECTION: SHX6855

## 2022-11-03 LAB — GLUCOSE, CAPILLARY: Glucose-Capillary: 255 mg/dL — ABNORMAL HIGH (ref 70–99)

## 2022-11-03 LAB — HM COLONOSCOPY

## 2022-11-03 SURGERY — COLONOSCOPY WITH PROPOFOL
Anesthesia: General

## 2022-11-03 MED ORDER — SODIUM CHLORIDE 0.9% FLUSH: 10.0000 mL | Freq: Two times a day (BID) | INTRAVENOUS | Status: DC

## 2022-11-03 MED ORDER — PROPOFOL 10 MG/ML IV BOLUS
INTRAVENOUS | Status: DC | PRN
  Administered 2022-11-03 (×2): 40 mg via INTRAVENOUS
  Administered 2022-11-03: 80 mg via INTRAVENOUS

## 2022-11-03 MED ORDER — LACTATED RINGERS IV SOLN: INTRAVENOUS | Status: DC | PRN

## 2022-11-03 MED ORDER — LIDOCAINE HCL (PF) 2 % IJ SOLN
INTRAMUSCULAR | Status: AC
  Filled 2022-11-03: qty 5

## 2022-11-03 MED ORDER — LIDOCAINE HCL (CARDIAC) PF 100 MG/5ML IV SOSY
PREFILLED_SYRINGE | INTRAVENOUS | Status: DC | PRN
  Administered 2022-11-03: 80 mg via INTRAVENOUS

## 2022-11-03 MED ORDER — LIDOCAINE HCL (PF) 2 % IJ SOLN
INTRAMUSCULAR | Status: AC
  Filled 2022-11-03: qty 20

## 2022-11-03 MED ORDER — DEXMEDETOMIDINE HCL IN NACL 80 MCG/20ML IV SOLN
INTRAVENOUS | Status: DC | PRN
  Administered 2022-11-03: 6 ug via INTRAVENOUS

## 2022-11-03 MED ORDER — DEXMEDETOMIDINE HCL IN NACL 80 MCG/20ML IV SOLN
INTRAVENOUS | Status: AC
  Filled 2022-11-03: qty 20

## 2022-11-03 MED ORDER — PROPOFOL 1000 MG/100ML IV EMUL
INTRAVENOUS | Status: AC
  Filled 2022-11-03: qty 100

## 2022-11-03 MED ORDER — PROPOFOL 500 MG/50ML IV EMUL
INTRAVENOUS | Status: DC | PRN
  Administered 2022-11-03: 150 ug/kg/min via INTRAVENOUS

## 2022-11-03 NOTE — Anesthesia Preprocedure Evaluation (Signed)
Anesthesia Evaluation  Patient identified by MRN, date of birth, ID band Patient awake    Reviewed: Allergy & Precautions, H&P , NPO status , Patient's Chart, lab work & pertinent test results, reviewed documented beta blocker date and time   Airway Mallampati: II  TM Distance: >3 FB Neck ROM: full    Dental no notable dental hx.    Pulmonary neg pulmonary ROS, sleep apnea    Pulmonary exam normal breath sounds clear to auscultation       Cardiovascular Exercise Tolerance: Good negative cardio ROS  Rhythm:regular Rate:Normal     Neuro/Psych Seizures -,  negative neurological ROS  negative psych ROS   GI/Hepatic negative GI ROS, Neg liver ROS,,,  Endo/Other  negative endocrine ROSdiabetes    Renal/GU negative Renal ROS  negative genitourinary   Musculoskeletal   Abdominal   Peds  Hematology negative hematology ROS (+)   Anesthesia Other Findings   Reproductive/Obstetrics negative OB ROS                             Anesthesia Physical Anesthesia Plan  ASA: 2  Anesthesia Plan: General   Post-op Pain Management:    Induction:   PONV Risk Score and Plan: Propofol infusion  Airway Management Planned:   Additional Equipment:   Intra-op Plan:   Post-operative Plan:   Informed Consent: I have reviewed the patients History and Physical, chart, labs and discussed the procedure including the risks, benefits and alternatives for the proposed anesthesia with the patient or authorized representative who has indicated his/her understanding and acceptance.     Dental Advisory Given  Plan Discussed with: CRNA  Anesthesia Plan Comments:        Anesthesia Quick Evaluation

## 2022-11-03 NOTE — Discharge Instructions (Addendum)
You are being discharged to home.  Resume your previous diet.  We are waiting for your pathology results.  Your physician has recommended a repeat colonoscopy (date to be determined after pending pathology results are reviewed) for screening purposes.  

## 2022-11-03 NOTE — Op Note (Signed)
[facility] Patient Name: Tommy Newman Procedure Date: 11/03/2022 10:19 AM MRN: [id number] Date of Birth: 1975/05/09 Attending MD: [name] , , [id number] CSN: [id number] Age: 47 Admit Type: Outpatient Procedure:                Colonoscopy Indications:              Clinically significant diarrhea of unexplained                            origin, Positive Cologuard test Providers:                [name], Francoise Ceo RN, RN, Angelica Ran,                            Kristine L. Jessee Avers, Technician Referring MD:              Medicines:                Monitored Anesthesia Care Complications:            No immediate complications. Estimated Blood Loss:     Estimated blood loss: none. Procedure:                Pre-Anesthesia Assessment:                           - Prior to the procedure, a History and Physical                            was performed, and patient medications, allergies                            and sensitivities were reviewed. The patient's                            tolerance of previous anesthesia was reviewed.                           - The risks and benefits of the procedure and the                            sedation options and risks were discussed with the                            patient. All questions were answered and informed                            consent was obtained.                           - ASA Grade Assessment: II - A patient with mild                            systemic disease.                           After obtaining informed consent, the colonoscope  was passed under direct vision. Throughout the                            procedure, the patient's blood pressure, pulse, and                            oxygen saturations were monitored continuously. The                            [id number]-[id number] ([id number]) scope was introduced through                            the anus and advanced to the the  cecum, identified                            by appendiceal orifice and ileocecal valve. The                            colonoscopy was performed without difficulty. The                            patient tolerated the procedure well. The quality                            of the bowel preparation was good. Scope In: 10:38:39 AM Scope Out: 11:13:15 AM Scope Withdrawal Time: 0 hours 26 minutes 47 seconds  Total Procedure Duration: 0 hours 34 minutes 36 seconds  Findings:      The perianal and digital rectal examinations were normal.      A 10 mm polyp was found in the cecum. The polyp was flat. Area was       successfully injected with 3 mL Eleview for a lift polypectomy. Imaging       was performed using white light and narrow band imaging to visualize the       mucosa and demarcate the polyp site after injection for EMR purposes.       The polyp was removed with a cold snare. Resection and retrieval were       complete. To prevent bleeding after the polypectomy, two hemostatic       clips were successfully placed. Clip manufacturer: [employer].       There was no bleeding at the end of the procedure.      A 5 mm polyp was found in the ascending colon. The polyp was sessile.       The polyp was removed with a cold snare. Resection and retrieval were       complete.      The rest of colon appeared normal. Biopsies for histology were taken       with a cold forceps from the right colon and left colon for evaluation       of microscopic colitis.      Non-bleeding internal hemorrhoids were found during retroflexion. The       hemorrhoids were small. Impression:               - One 10 mm polyp in the cecum, removed with a cold  snare. Resected and retrieved. Injected. Clips were                            placed. Clip manufacturer: [employer].                           - One 5 mm polyp in the ascending colon, removed                            with a  cold snare. Resected and retrieved.                           - The rest of the examined colon is normal.                            Biopsied.                           - Non-bleeding internal hemorrhoids. Moderate Sedation:      Per Anesthesia Care Recommendation:           - Discharge patient to home (ambulatory).                           - Resume previous diet.                           - Await pathology results.                           - Repeat colonoscopy date to be determined after                            pending pathology results are reviewed for                            screening purposes. Procedure Code(s):        --- Professional ---                           [phone], 59, Colonoscopy, flexible; with removal of                            tumor(s), polyp(s), or other lesion(s) by snare                            technique                           45380, 59, Colonoscopy, flexible; with biopsy,                            single or multiple                           45381, Colonoscopy, flexible; with directed  submucosal injection(s), any substance Diagnosis Code(s):        --- Professional ---                           D12.0, Benign neoplasm of cecum                           D12.2, Benign neoplasm of ascending colon                           K64.8, Other hemorrhoids                           R19.7, Diarrhea, unspecified                           R19.5, Other fecal abnormalities CPT copyright 2022 American Medical Association. All rights reserved. The codes documented in this report are preliminary and upon coder review may  be revised to meet current compliance requirements. [name], MD [name],  11/03/2022 11:20:45 AM This report has been signed electronically. Number of Addenda: 0

## 2022-11-03 NOTE — Interval H&P Note (Signed)
History and Physical Interval Note:  11/03/2022 9:02 AM  Tommy Newman  has presented today for surgery, with the diagnosis of POSITIVE COLOGUARD.  The various methods of treatment have been discussed with the patient and family. After consideration of risks, benefits and other options for treatment, the patient has consented to  Procedure(s) with comments: COLONOSCOPY WITH PROPOFOL (N/A) - 10:15AM;ASA 1 as a surgical intervention.  The patient's history has been reviewed, patient examined, no change in status, stable for surgery.  I have reviewed the patient's chart and labs.  Questions were answered to the patient's satisfaction.     [name] Mayorga

## 2022-11-03 NOTE — Transfer of Care (Addendum)
Immediate Anesthesia Transfer of Care Note  Patient: Tommy Newman  Procedure(s) Performed: COLONOSCOPY WITH PROPOFOL POLYPECTOMY BIOPSY SUBMUCOSAL LIFTING INJECTION HEMOSTASIS CLIP PLACEMENT  Patient Location: PACU and Endoscopy Unit  Anesthesia Type:General  Level of Consciousness: drowsy and patient cooperative  Airway & Oxygen Therapy: Patient Spontanous Breathing and Patient connected to nasal cannula oxygen  Post-op Assessment: Report given to RN and Post -op Vital signs reviewed and stable  Post vital signs: Reviewed and stable  Last Vitals:  Vitals Value Taken Time  BP 99/55 11/03/22 1118  Temp 36.6 C 11/03/22 1118  Pulse 70 11/03/22 1118  Resp 15 11/03/22 1118  SpO2 96 % 11/03/22 1118    Last Pain:  Vitals:   11/03/22 1118  TempSrc: Oral  PainSc:       Patients Stated Pain Goal: 5 (11/03/22 0906)  Complications: No notable events documented.

## 2022-11-06 ENCOUNTER — Encounter (INDEPENDENT_AMBULATORY_CARE_PROVIDER_SITE_OTHER): Payer: Self-pay | Admitting: Gastroenterology

## 2022-11-06 LAB — SURGICAL PATHOLOGY

## 2022-11-07 ENCOUNTER — Encounter (INDEPENDENT_AMBULATORY_CARE_PROVIDER_SITE_OTHER): Payer: Self-pay | Admitting: *Deleted

## 2022-11-07 NOTE — Anesthesia Postprocedure Evaluation (Signed)
Anesthesia Post Note  Patient: Tommy Newman  Procedure(s) Performed: COLONOSCOPY WITH PROPOFOL POLYPECTOMY BIOPSY SUBMUCOSAL LIFTING INJECTION HEMOSTASIS CLIP PLACEMENT  Patient location during evaluation: Phase II Anesthesia Type: General Level of consciousness: awake Pain management: pain level controlled Vital Signs Assessment: post-procedure vital signs reviewed and stable Respiratory status: spontaneous breathing and respiratory function stable Cardiovascular status: blood pressure returned to baseline and stable Postop Assessment: no headache and no apparent nausea or vomiting Anesthetic complications: no Comments: Late entry   No notable events documented.   Last Vitals:  Vitals:   11/03/22 1118 11/03/22 1127  BP: (!) 99/55 (!) 98/57  Pulse: 70 73  Resp: 15 16  Temp: 36.6 C   SpO2: 96% 96%    Last Pain:  Vitals:   11/03/22 1127  TempSrc:   PainSc: 0-No pain                 Windell Norfolk

## 2022-11-09 ENCOUNTER — Encounter (HOSPITAL_COMMUNITY): Payer: Self-pay | Admitting: Gastroenterology

## 2022-11-28 ENCOUNTER — Other Ambulatory Visit: Payer: Self-pay

## 2022-11-28 ENCOUNTER — Encounter: Payer: Self-pay | Admitting: Endocrinology

## 2022-11-28 DIAGNOSIS — E1065 Type 1 diabetes mellitus with hyperglycemia: Secondary | ICD-10-CM

## 2022-11-28 MED ORDER — INSULIN LISPRO 100 UNIT/ML IJ SOLN: INTRAMUSCULAR | 3 refills | Status: DC

## 2022-12-29 ENCOUNTER — Other Ambulatory Visit: Payer: Self-pay

## 2022-12-29 ENCOUNTER — Encounter: Payer: Self-pay | Admitting: Endocrinology

## 2023-01-01 ENCOUNTER — Other Ambulatory Visit: Payer: Self-pay

## 2023-01-01 DIAGNOSIS — E1065 Type 1 diabetes mellitus with hyperglycemia: Secondary | ICD-10-CM

## 2023-01-01 MED ORDER — OMNIPOD 5 DEXG7G6 PODS GEN 5 MISC: 1.0000 | 3 refills | Status: DC

## 2023-01-01 NOTE — Telephone Encounter (Signed)
Omnipods refill request sent

## 2023-01-02 ENCOUNTER — Encounter: Payer: Self-pay | Admitting: Endocrinology

## 2023-01-02 ENCOUNTER — Ambulatory Visit: Payer: BC Managed Care – PPO | Admitting: Endocrinology

## 2023-01-02 VITALS — BP 122/72 | HR 68 | Resp 20 | Ht 68.0 in | Wt 194.2 lb

## 2023-01-02 DIAGNOSIS — E1065 Type 1 diabetes mellitus with hyperglycemia: Secondary | ICD-10-CM

## 2023-01-02 DIAGNOSIS — Z794 Long term (current) use of insulin: Secondary | ICD-10-CM

## 2023-01-02 LAB — POCT GLYCOSYLATED HEMOGLOBIN (HGB A1C): Hemoglobin A1C: 6.8 % — AB (ref 4.0–5.6)

## 2023-01-02 NOTE — Progress Notes (Signed)
 Outpatient Endocrinology Note Krew Hortman, MD   Patient's Name: Tommy Newman    DOB: [birth year]    MRN: [id number]                                                    REASON OF VISIT: Follow up for type 1 diabetes mellitus  PCP: Paola Greig CROME, FNP  HISTORY OF PRESENT ILLNESS:   Donavan Kerlin is a 47 y.o. old male with past medical history listed below, is here for follow up for type 1 diabetes mellitus.   Pertinent Diabetes History: Patient was previously seen by Dr. Von and was last time seen in August 2024.  Patient was diagnosed with type 1 diabetes mellitus in 1991.  He has been on insulin  pump since 1997.  He used to be on Medtronic 780G pump.  Switched to OmniPod 5 insulin  pump therapy in ~ August 2024.  Chronic Diabetes Complications : Retinopathy: no. Last ophthalmology exam was done on Due.  Nephropathy: no Peripheral neuropathy: no Coronary artery disease: no Stroke: on  Relevant comorbidities and cardiovascular risk factors: Obesity: no Body mass index is 29.53 kg/m.  Hypertension: no Hyperlipidemia : Yes, on statin.   Current / Home Diabetic regimen includes:  OmniPod 5 with Dexcom G7 using Humalog  U100  Insulin  Pump setting:  Basal MN- 0.8u/hour 5AM- 1.0  9AM- 1.1 1:30PM-1.15 5PM-    1.45 9PM-    1.5  Bolus CHO Ratio (1unit:CHO) MN- 1:15 4PM- 1:18  Correction/Sensitivity: MN- 1:40  Target: 110  Active insulin  time: 2 hours  Prior diabetic medications: Medtronic 780G insulin  pump, switched to OmniPod 5 in August 2024.  CONTINUOUS GLUCOSE MONITORING SYSTEM (CGMS) / INSULIN  PUMP INTERPRETATION:                         OmniPod 5 Pump & Sensor Dexcom G7 download (Reviewed and summarized below.)  Dates: December 18 to January 02, 2023, 14 days  Glucose Management Indicator: n/a% CGM/Sensor usage:64% Time in range :72%   Average daily carbs entered: 346 g Average total daily insulin :  49 units, Basal: 45% ( 22 units), Bolus:  55%  Trends:  Variable blood sugar mainly related with exiting out of automatic mode, with occasional hyperglycemia related with meals and hyperglycemia in between the meals.  In manual and automatic limited mode he has hyperglycemia with blood sugar up to 300 range related with late meal bolus, not enough meal bolus.  Blood sugars are mostly acceptable when on automatic mode.  He has been exiting out of automatic mode and loss of signals intermittently and has been happening frequently in last 3 - 4 days.  Hypoglycemia: Patient has minor hypoglycemic episodes. Patient has hypoglycemia awareness.    Factors modifying glucose control: 1.  Diabetic diet assessment: 3 meals a day.  2.  Staying active or exercising:   3.  Medication compliance: compliant most of the time.  Interval history  Pump and CGM data as reviewed above.  Goal range today 6.8%.  He has variable blood sugar mainly on manual and automatic limited mode.  Denies complaints of numbness and tingling of the feet.  No vision problem.  No other complaints today.  REVIEW OF SYSTEMS As per history of present illness.   PAST MEDICAL HISTORY: Past Medical History:  Diagnosis Date  Diabetes mellitus without complication (HCC)    Seizures (HCC) since 2008   Sleep apnea     PAST SURGICAL HISTORY: Past Surgical History:  Procedure Laterality Date   APPENDECTOMY     BIOPSY  11/03/2022   Procedure: BIOPSY;  Surgeon: Eartha Angelia Sieving, MD;  [facility];  Service: Gastroenterology;;   COLONOSCOPY WITH PROPOFOL  N/A 11/03/2022   Procedure: COLONOSCOPY WITH PROPOFOL ;  Surgeon: Eartha Angelia Sieving, MD;  [facility];  Service: Gastroenterology;  Laterality: N/A;  10:15AM;ASA 1   HEMOSTASIS CLIP PLACEMENT  11/03/2022   Procedure: HEMOSTASIS CLIP PLACEMENT;  Surgeon: Eartha Angelia Sieving, MD;  [facility];  Service: Gastroenterology;;   POLYPECTOMY  11/03/2022   Procedure:  POLYPECTOMY;  Surgeon: Eartha Angelia, Sieving, MD;  [facility];  Service: Gastroenterology;;   SUBMUCOSAL LIFTING INJECTION  11/03/2022   Procedure: SUBMUCOSAL LIFTING INJECTION;  Surgeon: Eartha Angelia, Sieving, MD;  [facility];  Service: Gastroenterology;;   TONSILLECTOMY      ALLERGIES: No Known Allergies  FAMILY HISTORY:  Family History  Problem Relation Age of Onset   Diabetes Neg Hx    Heart disease Neg Hx     SOCIAL HISTORY: Social History   Socioeconomic History   Marital status: Married    Spouse name: Not on file   Number of children: Not on file   Years of education: Not on file   Highest education level: Not on file  Occupational History   Not on file  Tobacco Use   Smoking status: Never   Smokeless tobacco: Never  Vaping Use   Vaping status: Never Used  Substance and Sexual Activity   Alcohol use: Yes    Comment: rarely   Drug use: Never   Sexual activity: Not on file  Other Topics Concern   Not on file  Social History Narrative   Not on file   Social Drivers of Health   Financial Resource Strain: Not on file  Food Insecurity: Not on file  Transportation Needs: Not on file  Physical Activity: Not on file  Stress: Not on file  Social Connections: Not on file    MEDICATIONS:  Current Outpatient Medications  Medication Sig Dispense Refill   atorvastatin  (LIPITOR) 10 MG tablet Take 1 tablet (10 mg total) by mouth daily. 90 tablet 3   citalopram (CELEXA) 20 MG tablet Take 20 mg by mouth daily.     Continuous Glucose Sensor (DEXCOM G7 SENSOR) MISC by Does not apply route.     glucose blood (BAYER CONTOUR NEXT TEST) test strip Use as instructed to check blood sugar 7-8 times per day dx code E10.65 725 each 1   Insulin  Disposable Pump (OMNIPOD 5 DEXG7G6 PODS GEN 5) MISC 1 each by Does not apply route every 3 (three) days. 30 each 3   Insulin  Disposable Pump (OMNIPOD 5 G6 INTRO, GEN 5,) KIT SMARTSIG:SUB-Q Every 3 Days      insulin  lispro (HUMALOG ) 100 UNIT/ML injection INJECT SUBCUTANEOUSLY UP TO 60  UNITS DAILY VIA INSULIN  PUMP 60 mL 3   lamoTRIgine  (LAMICTAL ) 200 MG tablet Take 1 tablet (200 mg total) by mouth 2 (two) times daily. (Patient taking differently: Take 400 mg by mouth daily.) 60 tablet 0   zolpidem (AMBIEN) 5 MG tablet Take 5 mg by mouth at bedtime.     Continuous Glucose Sensor (DEXCOM G6 SENSOR) MISC Use to monitor blood sugar, change after 10 days (Patient not taking: Reported on 01/02/2023)  3 each 3   Continuous Glucose Transmitter (DEXCOM G6 TRANSMITTER) MISC 1 Device by Does not apply route continuous. (Patient not taking: Reported on 01/02/2023) 1 each 3   No current facility-administered medications for this visit.    PHYSICAL EXAM: Vitals:   01/02/23 0911  BP: 122/72  Pulse: 68  Resp: 20  SpO2: 98%  Weight: 194 lb 3.2 oz (88.1 kg)  Height: 5' 8 (1.727 m)   Body mass index is 29.53 kg/m.  Wt Readings from Last 3 Encounters:  01/02/23 194 lb 3.2 oz (88.1 kg)  11/03/22 190 lb (86.2 kg)  10/12/22 191 lb 12.8 oz (87 kg)    General: Well developed, well nourished male in no apparent distress.  HEENT: AT/[place], no external lesions.  Eyes: Conjunctiva clear and no icterus. Neck: Neck supple  Lungs: Respirations not labored Neurologic: Alert, oriented, normal speech Extremities / Skin: Dry. No sores or rashes noted. No acanthosis nigricans Psychiatric: Does not appear depressed or anxious  Diabetic Foot Exam - Simple   Simple Foot Form Diabetic Foot exam was performed with the following findings: Yes 01/02/2023  9:44 AM  Visual Inspection See comments: Yes Sensation Testing Intact to touch and monofilament testing bilaterally: Yes Pulse Check See comments: Yes Comments DP 2 + bilaterally.  Right great toe with callus +    LABS Reviewed Lab Results  Component Value Date   HGBA1C 6.8 (A) 01/02/2023   HGBA1C 7.0 (A) 08/17/2022   HGBA1C 7.0 (A) 03/30/2022   No results  found for: FRUCTOSAMINE Lab Results  Component Value Date   CHOL 158 08/17/2022   HDL 68.10 08/17/2022   LDLCALC 80 08/17/2022   TRIG 48.0 08/17/2022   CHOLHDL 2 08/17/2022   Lab Results  Component Value Date   MICRALBCREAT 1.0 03/30/2022   MICRALBCREAT 1.4 03/22/2021   Lab Results  Component Value Date   CREATININE 1.12 08/17/2022   Lab Results  Component Value Date   GFR 79.22 08/17/2022    ASSESSMENT / PLAN  1. Uncontrolled type 1 diabetes mellitus with hyperglycemia (HCC)     Diabetes Mellitus type 1, complicated by no known complications. - Diabetic status / severity: Uncontrolled.  Lab Results  Component Value Date   HGBA1C 6.8 (A) 01/02/2023    - Hemoglobin A1c goal <6.5%   Discussed connectivity issue with Dexcom G7.  Advised to keep OmniPod controller and phone with Dexcom G7 close together all the time.  - Medications:  Insulin  pump setting changed as follows:  OmniPod 5 with Dexcom G7 using Humalog  U100  Insulin  Pump setting:  Basal MN- 0.8u/hour 5AM- 1.0  9AM- 1.1 1:30PM-    1.15 5PM-         1.45, changed to 1.2 9PM-         1.5, changed to 1.0  Bolus CHO Ratio (1unit:CHO) MN- 1:15, changed to 1 : 12 4PM- 1:18, changed to 1: 12  Correction/Sensitivity: MN- 1:40  Target: 110  Active insulin  time: 2 hours  Patient wants to use mobile app instead of control for OmniPod 5, patient will talk with diabetes educator in the clinic today.  - Home glucose testing: continue CGM and check blood glucose as needed.  - Discussed/ Gave Hypoglycemia treatment plan.  # Consult : not required at this time.   # Annual urine for microalbuminuria/ creatinine ratio, no microalbuminuria currently. Last  Lab Results  Component Value Date   MICRALBCREAT 1.0 03/30/2022    # Foot check nightly.  # Annual  dilated diabetic eye exams.  Advised to have diabetic eye exam.  He is due for diabetic eye exam.  - Diet: Make healthy diabetic food choices -  Life style / activity / exercise: Discussed.  2. Blood pressure  -  BP Readings from Last 1 Encounters:  01/02/23 122/72    - Control is in target.  - No change in current plans.  3. Lipid status / Hyperlipidemia - Last  Lab Results  Component Value Date   LDLCALC 80 08/17/2022   - Continue atorvastatin  10 mg daily.  Diagnoses and all orders for this visit:  Uncontrolled type 1 diabetes mellitus with hyperglycemia (HCC) -     POCT glycosylated hemoglobin (Hb A1C)    DISPOSITION Follow up in clinic in 3  months suggested.   All questions answered and patient verbalized understanding of the plan.  Armida Vickroy, MD [facility] [address] [place], [address] [place], [place] [address] Phone # [phone]  At least part of this note was generated using voice recognition software. Inadvertent word errors may have occurred, which were not recognized during the proofreading process.

## 2023-02-06 ENCOUNTER — Encounter: Payer: Self-pay | Admitting: Endocrinology

## 2023-02-07 ENCOUNTER — Telehealth: Payer: Self-pay | Admitting: *Deleted

## 2023-02-07 NOTE — Telephone Encounter (Signed)
 Pt requesting sample for Dexcom G7 sensor. --notified pt through [facility] message have 1 box Dexcom G7 sensor ready for pick-up at the front office.

## 2023-04-02 ENCOUNTER — Encounter: Payer: Self-pay | Admitting: Endocrinology

## 2023-04-02 ENCOUNTER — Ambulatory Visit: Payer: BC Managed Care – PPO | Admitting: Endocrinology

## 2023-04-02 VITALS — BP 134/86 | HR 68 | Ht 68.0 in | Wt 196.0 lb

## 2023-04-02 DIAGNOSIS — E1065 Type 1 diabetes mellitus with hyperglycemia: Secondary | ICD-10-CM | POA: Diagnosis not present

## 2023-04-02 LAB — POCT GLYCOSYLATED HEMOGLOBIN (HGB A1C): Hemoglobin A1C: 6.4 % — AB (ref 4.0–5.6)

## 2023-04-02 NOTE — Progress Notes (Signed)
 Outpatient Endocrinology Note [place] Mailen Newborn, MD   Patient's Name: Tommy Newman    DOB: 1975-05-15    MRN: [id number]                                                    REASON OF VISIT: Follow up for type 1 diabetes mellitus  PCP: Vanessa [place], FNP  HISTORY OF PRESENT ILLNESS:   Malichi Palardy is a 48 y.o. old male with past medical history listed below, is here for follow up for type 1 diabetes mellitus.   Pertinent Diabetes History: Patient was previously seen by Dr. Lucianne Muss and was last time seen in August 2024.  Patient was diagnosed with type 1 diabetes mellitus in 1991.  He has been on insulin pump since 1997.  He used to be on Medtronic 780G pump.  Switched to OmniPod 5 insulin pump therapy in ~ August 2024.  Chronic Diabetes Complications : Retinopathy: no. Last ophthalmology exam was done on Due.  Nephropathy: no Peripheral neuropathy: no Coronary artery disease: no Stroke: on  Relevant comorbidities and cardiovascular risk factors: Obesity: no Body mass index is 29.8 kg/m.  Hypertension: no Hyperlipidemia : Yes, on statin.   Current / Home Diabetic regimen includes:  OmniPod 5 with Dexcom G7 using Humalog U100  Insulin Pump setting:  Basal MN- 0.8u/hour 5AM- 1.0  9AM- 1.1 1:30PM-1.15 5PM-    1.2 9PM-    1.0  Bolus CHO Ratio (1unit:CHO) MN- 1:12 4PM- 1:12  Correction/Sensitivity: MN- 1:40  Target: 110  Active insulin time: 3 hours  Prior diabetic medications: Medtronic 780G insulin pump, switched to OmniPod 5 in August 2024.  CONTINUOUS GLUCOSE MONITORING SYSTEM (CGMS) / INSULIN PUMP INTERPRETATION:                         OmniPod 5 Pump & Sensor Dexcom G7 download (Reviewed and summarized below.)  Dates: March 18 to April 02, 2023, 14 days  Glucose Management Indicator: n/a% CGM/Sensor usage:76% Time in range :51% Automated mode 97% with automated Limited 11%   Average daily carbs entered: 280 g Average total daily insulin:  57  units, Basal: 52% ( 22 units), Bolus: 48%    Trends:  Postprandial hyperglycemia with blood sugar in the range of 180-290 mostly related to late meal bolus and sometime no meal bolus.  Mild hypoglycemia in between the meals.  Mostly acceptable blood sugar.  No concerning hypoglycemia.  Hypoglycemia: Patient has minor hypoglycemic episodes. Patient has hypoglycemia awareness.    Factors modifying glucose control: 1.  Diabetic diet assessment: 3 meals a day.  2.  Staying active or exercising:   3.  Medication compliance: compliant most of the time.  Interval history  Insulin pump and CGM data as reviewed above.  Hemoglobin A1c 6.4%.  Still with postprandial hyperglycemia related with timing of the meal boluses.  He reports he is having connectivity issues with the pod and CGM sensor occasionally.  Otherwise no complaints today.  REVIEW OF SYSTEMS As per history of present illness.   PAST MEDICAL HISTORY: Past Medical History:  Diagnosis Date   Diabetes mellitus without complication (HCC)    Seizures (HCC) since 2008   Sleep apnea     PAST SURGICAL HISTORY: Past Surgical History:  Procedure Laterality Date   APPENDECTOMY  BIOPSY  11/03/2022   Procedure: BIOPSY;  Surgeon: Dolores Frame, MD;  [facility];  Service: Gastroenterology;;   COLONOSCOPY WITH PROPOFOL N/A 11/03/2022   Procedure: COLONOSCOPY WITH PROPOFOL;  Surgeon: Dolores Frame, MD;  [facility];  Service: Gastroenterology;  Laterality: N/A;  10:15AM;ASA 1   HEMOSTASIS CLIP PLACEMENT  11/03/2022   Procedure: HEMOSTASIS CLIP PLACEMENT;  Surgeon: Dolores Frame, MD;  [facility];  Service: Gastroenterology;;   POLYPECTOMY  11/03/2022   Procedure: POLYPECTOMY;  Surgeon: Marguerita Merles, Reuel Boom, MD;  [facility];  Service: Gastroenterology;;   SUBMUCOSAL LIFTING INJECTION  11/03/2022   Procedure: SUBMUCOSAL LIFTING INJECTION;  Surgeon:  Marguerita Merles, Reuel Boom, MD;  [facility];  Service: Gastroenterology;;   TONSILLECTOMY      ALLERGIES: No Known Allergies  FAMILY HISTORY:  Family History  Problem Relation Age of Onset   Diabetes Neg Hx    Heart disease Neg Hx     SOCIAL HISTORY: Social History   Socioeconomic History   Marital status: Married    Spouse name: Not on file   Number of children: Not on file   Years of education: Not on file   Highest education level: Not on file  Occupational History   Not on file  Tobacco Use   Smoking status: Never   Smokeless tobacco: Never  Vaping Use   Vaping status: Never Used  Substance and Sexual Activity   Alcohol use: Yes    Comment: rarely   Drug use: Never   Sexual activity: Not on file  Other Topics Concern   Not on file  Social History Narrative   Not on file   Social Drivers of Health   Financial Resource Strain: Not on file  Food Insecurity: Not on file  Transportation Needs: Not on file  Physical Activity: Not on file  Stress: Not on file  Social Connections: Not on file    MEDICATIONS:  Current Outpatient Medications  Medication Sig Dispense Refill   atorvastatin (LIPITOR) 10 MG tablet Take 1 tablet (10 mg total) by mouth daily. 90 tablet 3   citalopram (CELEXA) 20 MG tablet Take 20 mg by mouth daily.     Continuous Glucose Sensor (DEXCOM G7 SENSOR) MISC by Does not apply route.     glucose blood (BAYER CONTOUR NEXT TEST) test strip Use as instructed to check blood sugar 7-8 times per day dx code E10.65 725 each 1   HYDROcodone-acetaminophen (NORCO/VICODIN) 5-325 MG tablet Take 1 tablet every 6 hours by oral route.     Insulin Disposable Pump (OMNIPOD 5 DEXG7G6 PODS GEN 5) MISC 1 each by Does not apply route every 3 (three) days. 30 each 3   Insulin Disposable Pump (OMNIPOD 5 G6 INTRO, GEN 5,) KIT SMARTSIG:SUB-Q Every 3 Days     insulin lispro (HUMALOG) 100 UNIT/ML injection INJECT SUBCUTANEOUSLY UP TO 60  UNITS DAILY VIA  INSULIN PUMP 60 mL 3   lamoTRIgine (LAMICTAL) 200 MG tablet Take 1 tablet (200 mg total) by mouth 2 (two) times daily. (Patient taking differently: Take 400 mg by mouth daily.) 60 tablet 0   zolpidem (AMBIEN) 5 MG tablet Take 5 mg by mouth at bedtime.     Continuous Glucose Sensor (DEXCOM G6 SENSOR) MISC Use to monitor blood sugar, change after 10 days (Patient not taking: Reported on 04/02/2023) 3 each 3   Continuous Glucose Transmitter (DEXCOM G6 TRANSMITTER) MISC 1 Device by Does not apply route  continuous. (Patient not taking: Reported on 04/02/2023) 1 each 3   traMADol HCl 100 MG TABS Take 1 tablet by mouth 2 (two) times daily. (Patient not taking: Reported on 04/02/2023)     No current facility-administered medications for this visit.    PHYSICAL EXAM: Vitals:   04/02/23 0832  BP: 134/86  Pulse: 68  SpO2: 98%  Weight: 196 lb (88.9 kg)  Height: 5\' 8"  (1.727 m)   Body mass index is 29.8 kg/m.  Wt Readings from Last 3 Encounters:  04/02/23 196 lb (88.9 kg)  01/02/23 194 lb 3.2 oz (88.1 kg)  11/03/22 190 lb (86.2 kg)    General: Well developed, well nourished male in no apparent distress.  HEENT: AT/[place], no external lesions.  Eyes: Conjunctiva clear and no icterus. Neck: Neck supple  Lungs: Respirations not labored Neurologic: Alert, oriented, normal speech Extremities / Skin: Dry. No sores or rashes noted. No acanthosis nigricans Psychiatric: Does not appear depressed or anxious  Diabetic Foot Exam - Simple   No data filed    LABS Reviewed Lab Results  Component Value Date   HGBA1C 6.4 (A) 04/02/2023   HGBA1C 6.8 (A) 01/02/2023   HGBA1C 7.0 (A) 08/17/2022   No results found for: "FRUCTOSAMINE" Lab Results  Component Value Date   CHOL 158 08/17/2022   HDL 68.10 08/17/2022   LDLCALC 80 08/17/2022   TRIG 48.0 08/17/2022   CHOLHDL 2 08/17/2022   Lab Results  Component Value Date   MICRALBCREAT 1.0 03/30/2022   MICRALBCREAT 1.4 03/22/2021   Lab Results   Component Value Date   CREATININE 1.12 08/17/2022   Lab Results  Component Value Date   GFR 79.22 08/17/2022    ASSESSMENT / PLAN  1. Uncontrolled type 1 diabetes mellitus with hyperglycemia (HCC)     Diabetes Mellitus type 1, complicated by no known complications. - Diabetic status / severity: Fair controlled.  Lab Results  Component Value Date   HGBA1C 6.4 (A) 04/02/2023    - Hemoglobin A1c goal <6.5%   Lately having postprandial hyperglycemia.  Discussed in detail about mealtime bolus preferably to bolus 10 to 15 minutes before eating.  Discussed connectivity issue with Dexcom G7.  Advised to keep OmniPod controller and phone with Dexcom G7 close together all the time.  - Medications:  Insulin pump setting :  OmniPod 5 with Dexcom G7 using Humalog U100  Insulin Pump setting: No change on the pump settings today. Basal MN- 0.8u/hour 5AM- 1.0  9AM- 1.1 1:30PM-    1.15 5PM-         1.2 9PM-         1.0  Bolus CHO Ratio (1unit:CHO) MN- 1:12 4PM- 1:12  Correction/Sensitivity: MN- 1:40  Target: 110  Active insulin time: 3 hours  - Home glucose testing: continue CGM and check blood glucose as needed.  - Discussed/ Gave Hypoglycemia treatment plan.  # Consult : not required at this time.   # Annual urine for microalbuminuria/ creatinine ratio, no microalbuminuria currently.  Will check urine microalbumin creatinine ratio today. Last  Lab Results  Component Value Date   MICRALBCREAT 1.0 03/30/2022    # Foot check nightly.  # Annual dilated diabetic eye exams.  Advised to have diabetic eye exam.  He is due for diabetic eye exam.  - Diet: Make healthy diabetic food choices - Life style / activity / exercise: Discussed.  2. Blood pressure  -  BP Readings from Last 1 Encounters:  04/02/23 134/86    -  Control is in target.  - No change in current plans.  3. Lipid status / Hyperlipidemia - Last  Lab Results  Component Value Date   LDLCALC 80  08/17/2022   - Continue atorvastatin 10 mg daily.  Andrzej was seen today for follow-up.  Diagnoses and all orders for this visit:  Uncontrolled type 1 diabetes mellitus with hyperglycemia (HCC) -     POCT glycosylated hemoglobin (Hb A1C) -     Microalbumin / creatinine urine ratio    DISPOSITION Follow up in clinic in 4  months suggested.   All questions answered and patient verbalized understanding of the plan.  [place] Daneille Desilva, MD [facility] End[facility] [address] [place], [address] [place], [place] [address] Phone # [phone]  At least part of this note was generated using voice recognition software. Inadvertent word errors may have occurred, which were not recognized during the proofreading process.

## 2023-04-02 NOTE — Patient Instructions (Signed)
 Latest Reference Range & Units 08/17/22 10:38 01/02/23 09:15 04/02/23 08:39  Hemoglobin A1C 4.0 - 5.6 % 7.0 ! 6.8 ! Pend 6.4 !  !: Data is abnormal

## 2023-04-03 LAB — MICROALBUMIN / CREATININE URINE RATIO
Creatinine, Urine: 35 mg/dL (ref 20–320)
Microalb, Ur: <0.2 mg/dL

## 2023-04-12 ENCOUNTER — Ambulatory Visit (INDEPENDENT_AMBULATORY_CARE_PROVIDER_SITE_OTHER): Payer: BC Managed Care – PPO | Admitting: Gastroenterology

## 2023-05-16 ENCOUNTER — Encounter: Payer: Self-pay | Admitting: Endocrinology

## 2023-05-16 ENCOUNTER — Other Ambulatory Visit: Payer: Self-pay | Admitting: Endocrinology

## 2023-05-16 DIAGNOSIS — E1065 Type 1 diabetes mellitus with hyperglycemia: Secondary | ICD-10-CM

## 2023-05-16 MED ORDER — BAQSIMI ONE PACK 3 MG/DOSE NA POWD: 1.0000 | NASAL | 3 refills | Status: DC | PRN

## 2023-05-16 NOTE — Telephone Encounter (Signed)
 Sent prescription

## 2023-07-27 ENCOUNTER — Encounter: Payer: Self-pay | Admitting: Endocrinology

## 2023-07-31 ENCOUNTER — Ambulatory Visit: Admitting: Endocrinology

## 2023-07-31 ENCOUNTER — Ambulatory Visit: Payer: Self-pay | Admitting: Endocrinology

## 2023-07-31 ENCOUNTER — Encounter: Payer: Self-pay | Admitting: Endocrinology

## 2023-07-31 VITALS — BP 132/70 | HR 63 | Resp 20 | Ht 68.0 in | Wt 196.0 lb

## 2023-07-31 DIAGNOSIS — E109 Type 1 diabetes mellitus without complications: Secondary | ICD-10-CM | POA: Diagnosis not present

## 2023-07-31 DIAGNOSIS — E785 Hyperlipidemia, unspecified: Secondary | ICD-10-CM | POA: Diagnosis not present

## 2023-07-31 LAB — POCT GLYCOSYLATED HEMOGLOBIN (HGB A1C): Hemoglobin A1C: 6.3 % — AB (ref 4.0–5.6)

## 2023-07-31 MED ORDER — DEXCOM G7 SENSOR MISC: Status: AC

## 2023-07-31 NOTE — Addendum Note (Signed)
 Addended by: ARELIA DIETRICH SAILOR on: 07/31/2023 01:22 PM   Modules accepted: Orders

## 2023-07-31 NOTE — Progress Notes (Signed)
 Outpatient Endocrinology Note [place] Coral Timme, MD   Patient's Name: Tommy Newman    DOB: 1975-12-03    MRN: [id number]                                                    REASON OF VISIT: Follow up for type 1 diabetes mellitus  PCP: Paola Greig CROME, FNP  HISTORY OF PRESENT ILLNESS:   Tequan Redmon is a 48 y.o. old male with past medical history listed below, is here for follow up for type 1 diabetes mellitus.   Pertinent Diabetes History: Patient was previously seen by Dr. Von and was last time seen in August 2024.  Patient was diagnosed with type 1 diabetes mellitus in 1991.  He has been on insulin  pump since 1997.  He used to be on Medtronic 780G pump.  Switched to OmniPod 5 insulin  pump therapy in ~ August 2024.  Chronic Diabetes Complications : Retinopathy: no. Last ophthalmology exam was done on Due.  Nephropathy: no Peripheral neuropathy: no Coronary artery disease: no Stroke: on  Relevant comorbidities and cardiovascular risk factors: Obesity: no Body mass index is 29.8 kg/m.  Hypertension: no Hyperlipidemia : Yes, on statin.   Current / Home Diabetic regimen includes:  OmniPod 5 with Dexcom G7 using Humalog  U100  Insulin  Pump setting:  Basal MN- 1.0u/hour 5AM- 1.1 9AM- 1.1 1:30PM-1.15 5PM-    1.2 9PM-    1.0  Bolus CHO Ratio (1unit:CHO) MN- 1:15 4PM- 1:15  Correction/Sensitivity: MN- 1:40  Target: 110  Active insulin  time: 3 hours  Prior diabetic medications: Medtronic 780G insulin  pump, switched to OmniPod 5 in August 2024.  CONTINUOUS GLUCOSE MONITORING SYSTEM (CGMS) / INSULIN  PUMP INTERPRETATION:                         OmniPod 5 Pump & Sensor Dexcom G7 download (Reviewed and summarized below.)  Dates: July 16 - July 29 , 2025, 14 days  Glucose Management Indicator: 7.3% CGM/Sensor usage:91% Time in range :59% Automated mode 100%    Average daily carbs entered: 303 g Average total daily insulin :  46 units, Basal: 52% ( 24 units),  Bolus: 48%     Trends:  Mostly acceptable blood sugar with random postprandial hyperglycemia, in the range of 200-300.  Occasionally mild hyperglycemia up to 200s.  Most of hyperglycemia related to not enough meal bolus.  And sometimes hyperglycemia related to no meal bolus before eating.  Rare hypoglycemia, one of the hypoglycemia is related to insulin  stacking with multiple meal boluses.  Hypoglycemia: Patient has minor hypoglycemic episodes. Patient has hypoglycemia awareness.    Factors modifying glucose control: 1.  Diabetic diet assessment: 3 meals a day.  2.  Staying active or exercising:   3.  Medication compliance: compliant most of the time.  Interval history  Pump and CGM data as reviewed above.  Hemoglobin A1c 6.3%.  Patient reports he sometimes has to bolus with false meal boluses due to persistent hyperglycemia.  He reports he has abnormal kidney lab test and being monitored by primary care provider.  He is due for diabetic eye exam.  No other complaints today.   REVIEW OF SYSTEMS As per history of present illness.   PAST MEDICAL HISTORY: Past Medical History:  Diagnosis Date   Diabetes mellitus without complication (HCC)  Seizures (HCC) since 2008   Sleep apnea     PAST SURGICAL HISTORY: Past Surgical History:  Procedure Laterality Date   APPENDECTOMY     BIOPSY  11/03/2022   Procedure: BIOPSY;  Surgeon: Eartha Angelia Sieving, MD;  [facility];  Service: Gastroenterology;;   COLONOSCOPY WITH PROPOFOL  N/A 11/03/2022   Procedure: COLONOSCOPY WITH PROPOFOL ;  Surgeon: Eartha Angelia Sieving, MD;  Location: [facility];  Service: Gastroenterology;  Laterality: N/A;  10:15AM;ASA 1   HEMOSTASIS CLIP PLACEMENT  11/03/2022   Procedure: HEMOSTASIS CLIP PLACEMENT;  Surgeon: Eartha Angelia Sieving, MD;  Location: [facility];  Service: Gastroenterology;;   POLYPECTOMY  11/03/2022   Procedure: POLYPECTOMY;  Surgeon: Eartha Angelia, Sieving, MD;   Location: [facility];  Service: Gastroenterology;;   SUBMUCOSAL LIFTING INJECTION  11/03/2022   Procedure: SUBMUCOSAL LIFTING INJECTION;  Surgeon: Eartha Angelia, Sieving, MD;  Location: [facility];  Service: Gastroenterology;;   TONSILLECTOMY      ALLERGIES: No Known Allergies  FAMILY HISTORY:  Family History  Problem Relation Age of Onset   Diabetes Neg Hx    Heart disease Neg Hx     SOCIAL HISTORY: Social History   Socioeconomic History   Marital status: Married    Spouse name: Not on file   Number of children: Not on file   Years of education: Not on file   Highest education level: Not on file  Occupational History   Not on file  Tobacco Use   Smoking status: Never   Smokeless tobacco: Never  Vaping Use   Vaping status: Never Used  Substance and Sexual Activity   Alcohol use: Yes    Comment: rarely   Drug use: Never   Sexual activity: Not on file  Other Topics Concern   Not on file  Social History Narrative   Not on file   Social Drivers of Health   Financial Resource Strain: Not on file  Food Insecurity: Not on file  Transportation Needs: Not on file  Physical Activity: Not on file  Stress: Not on file  Social Connections: Not on file    MEDICATIONS:  Current Outpatient Medications  Medication Sig Dispense Refill   atorvastatin  (LIPITOR) 10 MG tablet Take 1 tablet (10 mg total) by mouth daily. 90 tablet 3   citalopram (CELEXA) 20 MG tablet Take 20 mg by mouth daily.     Continuous Glucose Sensor (DEXCOM G6 SENSOR) MISC Use to monitor blood sugar, change after 10 days 3 each 3   Continuous Glucose Sensor (DEXCOM G7 SENSOR) MISC by Does not apply route.     Continuous Glucose Transmitter (DEXCOM G6 TRANSMITTER) MISC 1 Device by Does not apply route continuous. 1 each 3   Glucagon  (BAQSIMI  ONE PACK) 3 MG/DOSE POWD Place 1 Device into the nose as needed (Low blood sugar with impaired consciousness). 2 each 3   glucose blood (BAYER CONTOUR NEXT  TEST) test strip Use as instructed to check blood sugar 7-8 times per day dx code E10.65 725 each 1   HYDROcodone-acetaminophen (NORCO/VICODIN) 5-325 MG tablet Take 1 tablet every 6 hours by oral route.     Insulin  Disposable Pump (OMNIPOD 5 DEXG7G6 PODS GEN 5) MISC 1 each by Does not apply route every 3 (three) days. 30 each 3   Insulin  Disposable Pump (OMNIPOD 5 G6 INTRO, GEN 5,) KIT SMARTSIG:SUB-Q Every 3 Days     insulin  lispro (HUMALOG ) 100 UNIT/ML injection INJECT SUBCUTANEOUSLY UP TO 60  UNITS DAILY  VIA INSULIN  PUMP 60 mL 3   lamoTRIgine  (LAMICTAL ) 200 MG tablet Take 1 tablet (200 mg total) by mouth 2 (two) times daily. 60 tablet 0   traMADol HCl 100 MG TABS Take 1 tablet by mouth 2 (two) times daily.     zolpidem (AMBIEN) 5 MG tablet Take 5 mg by mouth at bedtime.     No current facility-administered medications for this visit.    PHYSICAL EXAM: Vitals:   07/31/23 0810  BP: 132/70  Pulse: 63  Resp: 20  SpO2: 95%  Weight: 196 lb (88.9 kg)  Height: 5' 8 (1.727 m)    Body mass index is 29.8 kg/m.  Wt Readings from Last 3 Encounters:  07/31/23 196 lb (88.9 kg)  04/02/23 196 lb (88.9 kg)  01/02/23 194 lb 3.2 oz (88.1 kg)    General: Well developed, well nourished male in no apparent distress.  HEENT: AT/[place], no external lesions.  Eyes: Conjunctiva clear and no icterus. Neck: Neck supple  Lungs: Respirations not labored Neurologic: Alert, oriented, normal speech Extremities / Skin: Dry. No sores or rashes noted. No acanthosis nigricans Psychiatric: Does not appear depressed or anxious  Diabetic Foot Exam - Simple   No data filed    LABS Reviewed Lab Results  Component Value Date   HGBA1C 6.3 (A) 07/31/2023   HGBA1C 6.4 (A) 04/02/2023   HGBA1C 6.8 (A) 01/02/2023   No results found for: FRUCTOSAMINE Lab Results  Component Value Date   CHOL 158 08/17/2022   HDL 68.10 08/17/2022   LDLCALC 80 08/17/2022   TRIG 48.0 08/17/2022   CHOLHDL 2 08/17/2022   Lab  Results  Component Value Date   MICRALBCREAT NOTE 04/02/2023   Lab Results  Component Value Date   CREATININE 1.12 08/17/2022   Lab Results  Component Value Date   GFR 79.22 08/17/2022    ASSESSMENT / PLAN  1. Controlled diabetes mellitus type 1 without complications (HCC)   2. Hyperlipidemia with target LDL less than 100      Diabetes Mellitus type 1, complicated by no known complications. - Diabetic status / severity: Fair controlled.  Lab Results  Component Value Date   HGBA1C 6.3 (A) 07/31/2023    - Hemoglobin A1c goal <6.5%   Lately having postprandial hyperglycemia.  Discussed in detail about mealtime bolus preferably to bolus 10 to 15 minutes before eating.  Adjusted carb ratio as follows.  - Medications:  Insulin  pump setting :  OmniPod 5 with Dexcom G7 using Humalog  U100   OmniPod 5 with Dexcom G7 using Humalog  U100  Insulin  Pump setting:  Basal MN- 1.0u/hour 5AM- 1.1 9AM- 1.1 1:30PM-1.15 5PM-    1.2 9PM-    1.0  Bolus CHO Ratio (1unit:CHO) MN- 1:15, changed to 1 : 12 4PM- 1:15, changed to 1:12  Correction/Sensitivity: MN- 1:40  Target: 110  Active insulin  time: 3 hours  - Home glucose testing: continue CGM and check blood glucose as needed.  - Discussed/ Gave Hypoglycemia treatment plan.  # Consult : not required at this time.   # Annual urine for microalbuminuria/ creatinine ratio, no microalbuminuria currently.  Last  Lab Results  Component Value Date   MICRALBCREAT NOTE 04/02/2023    # Foot check nightly.  # Annual dilated diabetic eye exams.  Advised to have diabetic eye exam.  He is due for diabetic eye exam.  - Diet: Make healthy diabetic food choices - Life style / activity / exercise: Discussed.  2. Blood pressure  -  BP Readings from Last 1 Encounters:  07/31/23 132/70    - Control is in target.  - No change in current plans.  3. Lipid status / Hyperlipidemia - Last  Lab Results  Component Value Date    LDLCALC 80 08/17/2022   - Continue atorvastatin  10 mg daily. - Will check lipid panel today.  He is fasting.  Diagnoses and all orders for this visit:  Controlled diabetes mellitus type 1 without complications (HCC) -     POCT glycosylated hemoglobin (Hb A1C) -     Lipid panel  Hyperlipidemia with target LDL less than 100 -     Lipid panel    DISPOSITION Follow up in clinic in 4  months suggested.   All questions answered and patient verbalized understanding of the plan.  [place] Emylie Amster, MD [facility] Endocrinology [facility] [address] [place], [address] [place], [place] [address] Phone # [phone]  At least part of this note was generated using voice recognition software. Inadvertent word errors may have occurred, which were not recognized during the proofreading process.

## 2023-08-01 ENCOUNTER — Other Ambulatory Visit: Payer: Self-pay | Admitting: Endocrinology

## 2023-08-01 DIAGNOSIS — E785 Hyperlipidemia, unspecified: Secondary | ICD-10-CM

## 2023-08-01 LAB — LIPID PANEL
Cholesterol: 135 mg/dL (ref <200)
HDL: 71 mg/dL (ref >=40)
LDL Cholesterol (Calc): 52 mg/dL
Non-HDL Cholesterol (Calc): 64 mg/dL (ref <130)
Total CHOL/HDL Ratio: 1.9 (calc) (ref <5.0)
Triglycerides: 42 mg/dL (ref <150)

## 2023-08-01 MED ORDER — ATORVASTATIN CALCIUM 10 MG PO TABS: 10.0000 mg | ORAL_TABLET | Freq: Every day | ORAL | 3 refills | Status: AC

## 2023-08-29 ENCOUNTER — Other Ambulatory Visit: Payer: Self-pay | Admitting: Endocrinology

## 2023-08-29 DIAGNOSIS — E1065 Type 1 diabetes mellitus with hyperglycemia: Secondary | ICD-10-CM

## 2023-10-07 ENCOUNTER — Other Ambulatory Visit: Payer: Self-pay | Admitting: Endocrinology

## 2023-10-07 DIAGNOSIS — E1065 Type 1 diabetes mellitus with hyperglycemia: Secondary | ICD-10-CM

## 2023-10-17 ENCOUNTER — Encounter (INDEPENDENT_AMBULATORY_CARE_PROVIDER_SITE_OTHER): Payer: Self-pay | Admitting: Gastroenterology

## 2023-12-03 ENCOUNTER — Ambulatory Visit: Admitting: Endocrinology
